# Patient Record
Sex: Male | Born: 1935 | Race: White | Hispanic: No | Marital: Single | Smoking: Former smoker
Health system: Southern US, Community
[De-identification: ages and names within clinical notes are randomized; demographics above are authoritative.]

## PROBLEM LIST (undated history)

## (undated) DIAGNOSIS — G459 Transient cerebral ischemic attack, unspecified: Secondary | ICD-10-CM

## (undated) DIAGNOSIS — M179 Osteoarthritis of knee, unspecified: Secondary | ICD-10-CM

## (undated) DIAGNOSIS — M069 Rheumatoid arthritis, unspecified: Secondary | ICD-10-CM

## (undated) DIAGNOSIS — M1710 Unilateral primary osteoarthritis, unspecified knee: Secondary | ICD-10-CM

## (undated) DIAGNOSIS — E039 Hypothyroidism, unspecified: Secondary | ICD-10-CM

## (undated) HISTORY — DX: Transient cerebral ischemic attack, unspecified: G45.9

## (undated) HISTORY — DX: Rheumatoid arthritis, unspecified: M06.9

## (undated) HISTORY — DX: Osteoarthritis of knee, unspecified: M17.9

## (undated) HISTORY — DX: Unilateral primary osteoarthritis, unspecified knee: M17.10

## (undated) HISTORY — DX: Hypothyroidism, unspecified: E03.9

---
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
## 1978-10-21 DIAGNOSIS — G459 Transient cerebral ischemic attack, unspecified: Secondary | ICD-10-CM

## 1978-10-21 HISTORY — DX: Transient cerebral ischemic attack, unspecified: G45.9

## 2004-02-20 HISTORY — PX: TOTAL KNEE ARTHROPLASTY: SHX125

## 2011-02-20 HISTORY — PX: INGUINAL HERNIA REPAIR: SUR1180

## 2011-02-22 DIAGNOSIS — M069 Rheumatoid arthritis, unspecified: Secondary | ICD-10-CM | POA: Diagnosis not present

## 2011-02-22 DIAGNOSIS — Z79899 Other long term (current) drug therapy: Secondary | ICD-10-CM | POA: Diagnosis not present

## 2011-03-22 DIAGNOSIS — H26019 Infantile and juvenile cortical, lamellar, or zonular cataract, unspecified eye: Secondary | ICD-10-CM | POA: Diagnosis not present

## 2011-03-22 DIAGNOSIS — H35369 Drusen (degenerative) of macula, unspecified eye: Secondary | ICD-10-CM | POA: Diagnosis not present

## 2011-04-04 DIAGNOSIS — H905 Unspecified sensorineural hearing loss: Secondary | ICD-10-CM | POA: Diagnosis not present

## 2011-04-04 DIAGNOSIS — H903 Sensorineural hearing loss, bilateral: Secondary | ICD-10-CM | POA: Diagnosis not present

## 2011-06-13 DIAGNOSIS — Z96659 Presence of unspecified artificial knee joint: Secondary | ICD-10-CM | POA: Diagnosis not present

## 2011-06-13 DIAGNOSIS — Z471 Aftercare following joint replacement surgery: Secondary | ICD-10-CM | POA: Diagnosis not present

## 2011-06-15 DIAGNOSIS — E785 Hyperlipidemia, unspecified: Secondary | ICD-10-CM | POA: Diagnosis not present

## 2011-06-15 DIAGNOSIS — N189 Chronic kidney disease, unspecified: Secondary | ICD-10-CM | POA: Diagnosis not present

## 2011-06-15 DIAGNOSIS — E039 Hypothyroidism, unspecified: Secondary | ICD-10-CM | POA: Diagnosis not present

## 2011-06-26 DIAGNOSIS — R300 Dysuria: Secondary | ICD-10-CM | POA: Diagnosis not present

## 2011-08-30 DIAGNOSIS — M069 Rheumatoid arthritis, unspecified: Secondary | ICD-10-CM | POA: Diagnosis not present

## 2011-08-30 DIAGNOSIS — Z79899 Other long term (current) drug therapy: Secondary | ICD-10-CM | POA: Diagnosis not present

## 2011-08-30 DIAGNOSIS — M25579 Pain in unspecified ankle and joints of unspecified foot: Secondary | ICD-10-CM | POA: Diagnosis not present

## 2011-09-11 DIAGNOSIS — N390 Urinary tract infection, site not specified: Secondary | ICD-10-CM | POA: Diagnosis not present

## 2011-09-11 DIAGNOSIS — D899 Disorder involving the immune mechanism, unspecified: Secondary | ICD-10-CM | POA: Diagnosis not present

## 2011-09-11 DIAGNOSIS — N401 Enlarged prostate with lower urinary tract symptoms: Secondary | ICD-10-CM | POA: Diagnosis not present

## 2011-09-20 DIAGNOSIS — H26019 Infantile and juvenile cortical, lamellar, or zonular cataract, unspecified eye: Secondary | ICD-10-CM | POA: Diagnosis not present

## 2011-10-19 DIAGNOSIS — Z23 Encounter for immunization: Secondary | ICD-10-CM | POA: Diagnosis not present

## 2011-10-25 DIAGNOSIS — H903 Sensorineural hearing loss, bilateral: Secondary | ICD-10-CM | POA: Diagnosis not present

## 2011-11-05 DIAGNOSIS — K4090 Unilateral inguinal hernia, without obstruction or gangrene, not specified as recurrent: Secondary | ICD-10-CM | POA: Diagnosis not present

## 2011-11-16 DIAGNOSIS — K4090 Unilateral inguinal hernia, without obstruction or gangrene, not specified as recurrent: Secondary | ICD-10-CM | POA: Diagnosis not present

## 2011-11-16 DIAGNOSIS — Z01818 Encounter for other preprocedural examination: Secondary | ICD-10-CM | POA: Diagnosis not present

## 2011-11-26 DIAGNOSIS — L570 Actinic keratosis: Secondary | ICD-10-CM | POA: Diagnosis not present

## 2011-11-26 DIAGNOSIS — L821 Other seborrheic keratosis: Secondary | ICD-10-CM | POA: Diagnosis not present

## 2011-11-26 DIAGNOSIS — L738 Other specified follicular disorders: Secondary | ICD-10-CM | POA: Diagnosis not present

## 2011-11-26 DIAGNOSIS — L578 Other skin changes due to chronic exposure to nonionizing radiation: Secondary | ICD-10-CM | POA: Diagnosis not present

## 2011-11-27 DIAGNOSIS — K4090 Unilateral inguinal hernia, without obstruction or gangrene, not specified as recurrent: Secondary | ICD-10-CM | POA: Diagnosis not present

## 2011-11-27 DIAGNOSIS — R1032 Left lower quadrant pain: Secondary | ICD-10-CM | POA: Diagnosis not present

## 2012-02-19 DIAGNOSIS — R197 Diarrhea, unspecified: Secondary | ICD-10-CM | POA: Diagnosis not present

## 2012-02-21 DIAGNOSIS — E860 Dehydration: Secondary | ICD-10-CM | POA: Diagnosis not present

## 2012-02-21 DIAGNOSIS — R5383 Other fatigue: Secondary | ICD-10-CM | POA: Diagnosis not present

## 2012-02-21 DIAGNOSIS — R109 Unspecified abdominal pain: Secondary | ICD-10-CM | POA: Diagnosis not present

## 2012-02-21 DIAGNOSIS — R404 Transient alteration of awareness: Secondary | ICD-10-CM | POA: Diagnosis not present

## 2012-02-21 DIAGNOSIS — R197 Diarrhea, unspecified: Secondary | ICD-10-CM | POA: Diagnosis not present

## 2012-02-24 DIAGNOSIS — R197 Diarrhea, unspecified: Secondary | ICD-10-CM | POA: Diagnosis not present

## 2012-02-24 DIAGNOSIS — K649 Unspecified hemorrhoids: Secondary | ICD-10-CM | POA: Diagnosis not present

## 2012-02-24 DIAGNOSIS — N179 Acute kidney failure, unspecified: Secondary | ICD-10-CM | POA: Diagnosis not present

## 2012-02-24 DIAGNOSIS — Z8249 Family history of ischemic heart disease and other diseases of the circulatory system: Secondary | ICD-10-CM | POA: Diagnosis not present

## 2012-02-24 DIAGNOSIS — M069 Rheumatoid arthritis, unspecified: Secondary | ICD-10-CM | POA: Diagnosis present

## 2012-02-24 DIAGNOSIS — Z7982 Long term (current) use of aspirin: Secondary | ICD-10-CM | POA: Diagnosis not present

## 2012-02-24 DIAGNOSIS — Z96659 Presence of unspecified artificial knee joint: Secondary | ICD-10-CM | POA: Diagnosis not present

## 2012-02-24 DIAGNOSIS — K5289 Other specified noninfective gastroenteritis and colitis: Secondary | ICD-10-CM | POA: Diagnosis not present

## 2012-02-24 DIAGNOSIS — K6389 Other specified diseases of intestine: Secondary | ICD-10-CM | POA: Diagnosis not present

## 2012-02-24 DIAGNOSIS — Z87891 Personal history of nicotine dependence: Secondary | ICD-10-CM | POA: Diagnosis not present

## 2012-02-24 DIAGNOSIS — E871 Hypo-osmolality and hyponatremia: Secondary | ICD-10-CM | POA: Diagnosis present

## 2012-02-24 DIAGNOSIS — R141 Gas pain: Secondary | ICD-10-CM | POA: Diagnosis not present

## 2012-02-24 DIAGNOSIS — E860 Dehydration: Secondary | ICD-10-CM | POA: Diagnosis not present

## 2012-02-24 DIAGNOSIS — E876 Hypokalemia: Secondary | ICD-10-CM | POA: Diagnosis not present

## 2012-02-24 DIAGNOSIS — K5730 Diverticulosis of large intestine without perforation or abscess without bleeding: Secondary | ICD-10-CM | POA: Diagnosis present

## 2012-03-20 DIAGNOSIS — M069 Rheumatoid arthritis, unspecified: Secondary | ICD-10-CM | POA: Diagnosis not present

## 2012-03-20 DIAGNOSIS — Z79899 Other long term (current) drug therapy: Secondary | ICD-10-CM | POA: Diagnosis not present

## 2012-03-20 DIAGNOSIS — M25579 Pain in unspecified ankle and joints of unspecified foot: Secondary | ICD-10-CM | POA: Diagnosis not present

## 2012-03-24 DIAGNOSIS — H26019 Infantile and juvenile cortical, lamellar, or zonular cataract, unspecified eye: Secondary | ICD-10-CM | POA: Diagnosis not present

## 2012-03-24 DIAGNOSIS — H33309 Unspecified retinal break, unspecified eye: Secondary | ICD-10-CM | POA: Diagnosis not present

## 2012-03-24 DIAGNOSIS — H35369 Drusen (degenerative) of macula, unspecified eye: Secondary | ICD-10-CM | POA: Diagnosis not present

## 2012-03-28 DIAGNOSIS — R197 Diarrhea, unspecified: Secondary | ICD-10-CM | POA: Diagnosis not present

## 2012-04-08 DIAGNOSIS — G589 Mononeuropathy, unspecified: Secondary | ICD-10-CM | POA: Diagnosis not present

## 2012-04-09 DIAGNOSIS — M25579 Pain in unspecified ankle and joints of unspecified foot: Secondary | ICD-10-CM | POA: Diagnosis not present

## 2012-04-09 DIAGNOSIS — Z79899 Other long term (current) drug therapy: Secondary | ICD-10-CM | POA: Diagnosis not present

## 2012-04-09 DIAGNOSIS — M069 Rheumatoid arthritis, unspecified: Secondary | ICD-10-CM | POA: Diagnosis not present

## 2012-04-22 DIAGNOSIS — G589 Mononeuropathy, unspecified: Secondary | ICD-10-CM | POA: Diagnosis not present

## 2012-04-22 DIAGNOSIS — R209 Unspecified disturbances of skin sensation: Secondary | ICD-10-CM | POA: Diagnosis not present

## 2012-05-09 DIAGNOSIS — E039 Hypothyroidism, unspecified: Secondary | ICD-10-CM | POA: Diagnosis not present

## 2012-05-09 DIAGNOSIS — M069 Rheumatoid arthritis, unspecified: Secondary | ICD-10-CM | POA: Diagnosis not present

## 2012-05-09 DIAGNOSIS — I739 Peripheral vascular disease, unspecified: Secondary | ICD-10-CM | POA: Diagnosis not present

## 2012-05-09 DIAGNOSIS — E785 Hyperlipidemia, unspecified: Secondary | ICD-10-CM | POA: Diagnosis not present

## 2012-06-17 DIAGNOSIS — Z471 Aftercare following joint replacement surgery: Secondary | ICD-10-CM | POA: Diagnosis not present

## 2012-06-17 DIAGNOSIS — Z96659 Presence of unspecified artificial knee joint: Secondary | ICD-10-CM | POA: Diagnosis not present

## 2012-08-12 DIAGNOSIS — E785 Hyperlipidemia, unspecified: Secondary | ICD-10-CM | POA: Diagnosis not present

## 2012-08-12 DIAGNOSIS — E039 Hypothyroidism, unspecified: Secondary | ICD-10-CM | POA: Diagnosis not present

## 2012-09-16 DIAGNOSIS — Z79899 Other long term (current) drug therapy: Secondary | ICD-10-CM | POA: Diagnosis not present

## 2012-09-16 DIAGNOSIS — N401 Enlarged prostate with lower urinary tract symptoms: Secondary | ICD-10-CM | POA: Diagnosis not present

## 2012-09-16 DIAGNOSIS — M25569 Pain in unspecified knee: Secondary | ICD-10-CM | POA: Diagnosis not present

## 2012-09-16 DIAGNOSIS — M069 Rheumatoid arthritis, unspecified: Secondary | ICD-10-CM | POA: Diagnosis not present

## 2012-09-16 DIAGNOSIS — M25579 Pain in unspecified ankle and joints of unspecified foot: Secondary | ICD-10-CM | POA: Diagnosis not present

## 2012-09-16 DIAGNOSIS — N390 Urinary tract infection, site not specified: Secondary | ICD-10-CM | POA: Diagnosis not present

## 2012-09-22 DIAGNOSIS — H26019 Infantile and juvenile cortical, lamellar, or zonular cataract, unspecified eye: Secondary | ICD-10-CM | POA: Diagnosis not present

## 2012-09-22 DIAGNOSIS — H33309 Unspecified retinal break, unspecified eye: Secondary | ICD-10-CM | POA: Diagnosis not present

## 2012-09-22 DIAGNOSIS — H35369 Drusen (degenerative) of macula, unspecified eye: Secondary | ICD-10-CM | POA: Diagnosis not present

## 2012-09-23 DIAGNOSIS — M25569 Pain in unspecified knee: Secondary | ICD-10-CM | POA: Diagnosis not present

## 2012-10-08 DIAGNOSIS — H35369 Drusen (degenerative) of macula, unspecified eye: Secondary | ICD-10-CM | POA: Diagnosis not present

## 2012-10-08 DIAGNOSIS — H33309 Unspecified retinal break, unspecified eye: Secondary | ICD-10-CM | POA: Diagnosis not present

## 2012-10-08 DIAGNOSIS — H26019 Infantile and juvenile cortical, lamellar, or zonular cataract, unspecified eye: Secondary | ICD-10-CM | POA: Diagnosis not present

## 2012-10-10 DIAGNOSIS — H2589 Other age-related cataract: Secondary | ICD-10-CM | POA: Diagnosis not present

## 2012-10-16 DIAGNOSIS — H2589 Other age-related cataract: Secondary | ICD-10-CM | POA: Diagnosis not present

## 2012-10-16 DIAGNOSIS — H52209 Unspecified astigmatism, unspecified eye: Secondary | ICD-10-CM | POA: Diagnosis not present

## 2012-10-30 DIAGNOSIS — H52209 Unspecified astigmatism, unspecified eye: Secondary | ICD-10-CM | POA: Diagnosis not present

## 2012-10-30 DIAGNOSIS — H2589 Other age-related cataract: Secondary | ICD-10-CM | POA: Diagnosis not present

## 2012-10-31 DIAGNOSIS — Z23 Encounter for immunization: Secondary | ICD-10-CM | POA: Diagnosis not present

## 2012-11-24 DIAGNOSIS — E039 Hypothyroidism, unspecified: Secondary | ICD-10-CM | POA: Diagnosis not present

## 2012-11-24 DIAGNOSIS — D649 Anemia, unspecified: Secondary | ICD-10-CM | POA: Diagnosis not present

## 2012-11-24 DIAGNOSIS — G988 Other disorders of nervous system: Secondary | ICD-10-CM | POA: Diagnosis not present

## 2012-11-25 DIAGNOSIS — L570 Actinic keratosis: Secondary | ICD-10-CM | POA: Diagnosis not present

## 2012-11-25 DIAGNOSIS — L578 Other skin changes due to chronic exposure to nonionizing radiation: Secondary | ICD-10-CM | POA: Diagnosis not present

## 2012-11-25 DIAGNOSIS — L821 Other seborrheic keratosis: Secondary | ICD-10-CM | POA: Diagnosis not present

## 2012-12-04 DIAGNOSIS — H6120 Impacted cerumen, unspecified ear: Secondary | ICD-10-CM | POA: Diagnosis not present

## 2012-12-04 DIAGNOSIS — H905 Unspecified sensorineural hearing loss: Secondary | ICD-10-CM | POA: Diagnosis not present

## 2013-01-02 DIAGNOSIS — M79609 Pain in unspecified limb: Secondary | ICD-10-CM | POA: Diagnosis not present

## 2013-01-05 DIAGNOSIS — M25579 Pain in unspecified ankle and joints of unspecified foot: Secondary | ICD-10-CM | POA: Diagnosis not present

## 2013-01-05 DIAGNOSIS — M069 Rheumatoid arthritis, unspecified: Secondary | ICD-10-CM | POA: Diagnosis not present

## 2013-01-05 DIAGNOSIS — Z79899 Other long term (current) drug therapy: Secondary | ICD-10-CM | POA: Diagnosis not present

## 2013-03-10 DIAGNOSIS — M069 Rheumatoid arthritis, unspecified: Secondary | ICD-10-CM | POA: Diagnosis not present

## 2013-03-10 DIAGNOSIS — M25549 Pain in joints of unspecified hand: Secondary | ICD-10-CM | POA: Diagnosis not present

## 2013-04-21 ENCOUNTER — Ambulatory Visit (INDEPENDENT_AMBULATORY_CARE_PROVIDER_SITE_OTHER): Payer: Medicare Other | Admitting: Internal Medicine

## 2013-04-21 ENCOUNTER — Encounter: Payer: Self-pay | Admitting: Internal Medicine

## 2013-04-21 ENCOUNTER — Ambulatory Visit: Payer: Self-pay | Admitting: Internal Medicine

## 2013-04-21 VITALS — BP 148/80 | HR 74 | Temp 98.3°F | Ht 64.5 in | Wt 148.0 lb

## 2013-04-21 DIAGNOSIS — M069 Rheumatoid arthritis, unspecified: Secondary | ICD-10-CM | POA: Diagnosis not present

## 2013-04-21 DIAGNOSIS — E039 Hypothyroidism, unspecified: Secondary | ICD-10-CM

## 2013-04-21 DIAGNOSIS — Z23 Encounter for immunization: Secondary | ICD-10-CM | POA: Diagnosis not present

## 2013-04-21 DIAGNOSIS — G459 Transient cerebral ischemic attack, unspecified: Secondary | ICD-10-CM | POA: Diagnosis not present

## 2013-04-21 LAB — LIPID PANEL
CHOLESTEROL: 182 mg/dL (ref 0–200)
HDL: 60.4 mg/dL (ref >39.00)
LDL Cholesterol: 107 mg/dL — ABNORMAL HIGH (ref 0–99)
Total CHOL/HDL Ratio: 3
Triglycerides: 74 mg/dL (ref 0.0–149.0)
VLDL: 14.8 mg/dL (ref 0.0–40.0)

## 2013-04-21 LAB — TSH: TSH: 4.21 u[IU]/mL (ref 0.35–5.50)

## 2013-04-21 LAB — T4, FREE: FREE T4: 0.71 ng/dL (ref 0.60–1.60)

## 2013-04-21 NOTE — Patient Instructions (Signed)
Please decrease your aspirin to 81 mg daily.

## 2013-04-21 NOTE — Progress Notes (Signed)
Pre visit review using our clinic review tool, if applicable. No additional management support is needed unless otherwise documented below in the visit note. 

## 2013-04-21 NOTE — Assessment & Plan Note (Signed)
Quiet on enbrel Seeing Dr Jefm Bryant  Will defer his labs to Dr Raliegh Ip

## 2013-04-21 NOTE — Progress Notes (Signed)
Subjective:    Patient ID: Isaac Dixon, male    DOB: 1920-11-21, 80 y.o.   MRN: [id number]  HPI Here to establish care Moved to [facility] with his partner Isaac Dixon in December They love it there Has started with Isaac Dixon in the fitness program Exercises regularly---had been a gymnast in high school (considered for Olympics but canceled due to [facility])  Has RA---on enbrel since 2000 Seeing Dr Jefm Bryant here Satisfied with this---"it has been a miracle drug" Mostly confined to hands  Has hypothyroidism First diagnosed last year  TSH had gone over 10 he believes Never had symptoms  Had TIA about 30 years ago Has taken aspirin since then No carotid blockage No problems since then Cholesterol levels are fine  No current outpatient prescriptions on file prior to visit.   No current facility-administered medications on file prior to visit.    No Known Allergies  Past Medical History  Diagnosis Date  . TIA (transient ischemic attack) 1980's    negative work up  . Rheumatoid arthritis     sees Dr Jefm Bryant  . Hypothyroidism   . Osteoarthritis, knee     just left---better after TKR.    Past Surgical History  Procedure Laterality Date  . Total knee arthroplasty Left 2006    original 1999 then replaced due to infection  . Tonsillectomy and adenoidectomy  1938  . Inguinal hernia repair  2013    Family History  Problem Relation Age of Onset  . COPD Father   . Heart disease Brother     History   Social History  . Marital Status: Significant Other    Spouse Name: N/A    Number of Children: 3  . Years of Education: N/A   Occupational History  . Not on file.   Social History Main Topics  . Smoking status: Former Research scientist (life sciences)  . Smokeless tobacco: Never Used  . Alcohol Use: Yes  . Drug Use: No  . Sexual Activity: Not on file   Other Topics Concern  . Not on file   Social History Narrative   Widowed 5/07   Together with Isaac Dixon since 2008.      Has living  will   Health care POA is partner Isaac Dixon. Son is alternate   Would accept attempts at resuscitation   No feeding tube if cognitively unaware   Review of Systems  Constitutional: Negative for fatigue and unexpected weight change.  HENT: Positive for hearing loss. Negative for dental problem.   Eyes: Negative for visual disturbance.  Respiratory: Negative for cough, chest tightness and shortness of breath.   Cardiovascular: Negative for chest pain, palpitations and leg swelling.  Gastrointestinal: Negative for nausea, vomiting, constipation and blood in stool.       No heartburn  Genitourinary: Negative for urgency, frequency and difficulty urinating.  Musculoskeletal: Negative for back pain and joint swelling.  Skin: Negative for rash.       No suspicious lesions  Neurological: Negative for dizziness, syncope, weakness, light-headedness and headaches.  Hematological: Negative for adenopathy. Does not bruise/bleed easily.  Psychiatric/Behavioral: Negative for sleep disturbance and dysphoric mood. The patient is not nervous/anxious.        Objective:   Physical Exam  Constitutional: He is oriented to person, place, and time. He appears well-developed and well-nourished. No distress.  HENT:  Mouth/Throat: Oropharynx is clear and moist. No oropharyngeal exudate.  Neck: Normal range of motion. Neck supple. No thyromegaly present.  Cardiovascular: Normal rate, regular rhythm  and normal heart sounds.  Exam reveals no gallop.   No murmur heard. Faint pedal pulses  Pulmonary/Chest: Effort normal and breath sounds normal. No respiratory distress. He has no wheezes. He has no rales.  Abdominal: Soft. There is no tenderness.  Musculoskeletal:  No active synovitis Mild ulnar deviation in hands  Lymphadenopathy:    He has no cervical adenopathy.  Neurological: He is alert and oriented to person, place, and time.  Skin: No rash noted. No erythema.  Psychiatric: He has a normal mood and  affect. His behavior is normal.          Assessment & Plan:

## 2013-04-21 NOTE — Assessment & Plan Note (Signed)
Doesn't really seem to have vascular disease No action unless new symptoms

## 2013-04-21 NOTE — Addendum Note (Signed)
Addended by: Despina Hidden on: 04/21/2013 01:01 PM   Modules accepted: Orders

## 2013-04-21 NOTE — Assessment & Plan Note (Signed)
Sounds subclinical but TSH high enough for low dose Rx Will check labs and cholesterol

## 2013-04-22 ENCOUNTER — Encounter: Payer: Self-pay | Admitting: *Deleted

## 2013-04-23 DIAGNOSIS — M069 Rheumatoid arthritis, unspecified: Secondary | ICD-10-CM | POA: Diagnosis not present

## 2013-05-12 ENCOUNTER — Ambulatory Visit: Payer: Self-pay | Admitting: Family Medicine

## 2013-06-09 DIAGNOSIS — M069 Rheumatoid arthritis, unspecified: Secondary | ICD-10-CM | POA: Diagnosis not present

## 2013-06-09 DIAGNOSIS — Z111 Encounter for screening for respiratory tuberculosis: Secondary | ICD-10-CM | POA: Diagnosis not present

## 2013-07-01 ENCOUNTER — Other Ambulatory Visit: Payer: Self-pay | Admitting: Family Medicine

## 2013-07-01 MED ORDER — LEVOTHYROXINE SODIUM 25 MCG PO TABS: 25.0000 ug | ORAL_TABLET | Freq: Every day | ORAL | Status: DC

## 2013-09-02 ENCOUNTER — Ambulatory Visit: Payer: Self-pay | Admitting: Internal Medicine

## 2013-09-10 DIAGNOSIS — M069 Rheumatoid arthritis, unspecified: Secondary | ICD-10-CM | POA: Diagnosis not present

## 2013-10-06 DIAGNOSIS — M25569 Pain in unspecified knee: Secondary | ICD-10-CM | POA: Diagnosis not present

## 2013-10-06 DIAGNOSIS — Z96659 Presence of unspecified artificial knee joint: Secondary | ICD-10-CM | POA: Diagnosis not present

## 2013-10-23 DIAGNOSIS — Z23 Encounter for immunization: Secondary | ICD-10-CM | POA: Diagnosis not present

## 2013-12-31 ENCOUNTER — Encounter: Payer: Self-pay | Admitting: Podiatry

## 2013-12-31 ENCOUNTER — Ambulatory Visit (INDEPENDENT_AMBULATORY_CARE_PROVIDER_SITE_OTHER): Payer: Medicare Other | Admitting: Podiatry

## 2013-12-31 VITALS — BP 134/60 | HR 72 | Resp 16 | Ht 64.0 in | Wt 145.0 lb

## 2013-12-31 DIAGNOSIS — L03818 Cellulitis of other sites: Secondary | ICD-10-CM

## 2013-12-31 DIAGNOSIS — L03012 Cellulitis of left finger: Secondary | ICD-10-CM

## 2013-12-31 MED ORDER — CEPHALEXIN 500 MG PO CAPS: 500.0000 mg | ORAL_CAPSULE | Freq: Three times a day (TID) | ORAL | Status: DC

## 2013-12-31 NOTE — Progress Notes (Signed)
   Subjective:    Patient ID: Isaac Dixon, male    DOB: 18-Aug-1920, 80 y.o.   MRN: [id number]  HPI Comments: 80 year old male presents the office today with his wife for complaints of a "boil" on the left big toe. He states his been present for approximate 4 days and has remained the same. He states that the areas. Painful in his pain for when he walks particularly. He has noticed some purulence around the nail. He's been soaking the foot in Epson salts and applying antibiotic ointment to the area. He's been taking Aleve for the pain. Denies any injury or trauma to the area. No other complaints at this time. Denies any systemic complaints such as fevers, chills, nausea, vomiting.  Toe Pain       Review of Systems  All other systems reviewed and are negative.      Objective:   Physical Exam AAO 3, NAD DP/PT pulses palpable decreased bilaterally. CRT less than 3 seconds Protective sensation intact with Simms Weinstein monofilament, vibratory sensation intact, Achilles tendon reflex intact. On the left hallux there is evidence of purulence identified from the proximal nail border and there is evidence of a granuloma like tissue around the nail border. There is erythema extending to the level of the IP joint. There is tenderness around the entire nail border. The nail is lifted from the underlying nail bed proximally. There are no areas of fluctuance or crepitus. There is no ascending cellulitis identified. Remaining nails without pathology. No other lesions identified. MMT 5/5, ROM WNL No calf pain, swelling, warmth, erythema.       Assessment & Plan:  80 year old male with paronychia, localized cellulitis left hallux. -Conservative versus surgical treatment discussed including alternatives, risks, complications. -At this time due to the lifting of the nail with purulence and cellulitis recommend removal of the toenail. I discussed with the patient that he is at high risk of  nonhealing due to decreased pulses as well as being on Enbrel. Patient understands these risks which include further infection, delayed or nonhealing, loss of toe and wishes to proceed with the procedure. Under sterile conditions a total of 2.5 mL of a one-to-one mixture of 2% lidocaine plain and 0.5% Marcaine plain was infiltrated in a hallux block fashion. Once anesthetized the skin was prepped in a sterile fashion. Next the left hallux nail was then removed without complications. Underlying tissue was debrided. Small amount of purulence was identified in the proximal nail border. Upon removal of the nail and debridement of the tissue no further purulence identified. The area was then irrigated. Silvadene was applied followed by dry sterile dressing. No tourniquet was used for the procedure not the conclusion there was noted to be an immediate capillary refill time noted to the digit. Patient tolerated the procedure well without any complications. -Post procedure instructions were discussed with the patient for which she verbally understood. -Prescribed Keflex. -Monitoring clinical signs or symptoms of worsening infection and directed to call the office immediately if any are to occur or go to the emergency room. -Discussed that if there not appear to be adequate healing in the appropriate time we'll obtain vascular studies. -Follow-up in 1 week or sooner if any problems are to arise. In the meantime, call the office with any questions, concerns, change in symptoms.

## 2013-12-31 NOTE — Patient Instructions (Addendum)

## 2014-01-01 ENCOUNTER — Encounter: Payer: Self-pay | Admitting: Podiatry

## 2014-01-01 DIAGNOSIS — IMO0002 Reserved for concepts with insufficient information to code with codable children: Secondary | ICD-10-CM | POA: Insufficient documentation

## 2014-01-04 DIAGNOSIS — L03039 Cellulitis of unspecified toe: Secondary | ICD-10-CM

## 2014-01-05 ENCOUNTER — Ambulatory Visit: Payer: Medicare Other | Admitting: Podiatry

## 2014-01-07 ENCOUNTER — Ambulatory Visit (INDEPENDENT_AMBULATORY_CARE_PROVIDER_SITE_OTHER): Payer: Medicare Other | Admitting: Podiatry

## 2014-01-07 ENCOUNTER — Encounter: Payer: Self-pay | Admitting: Podiatry

## 2014-01-07 VITALS — BP 125/65 | HR 77 | Resp 16

## 2014-01-07 DIAGNOSIS — L03012 Cellulitis of left finger: Secondary | ICD-10-CM

## 2014-01-07 DIAGNOSIS — L03039 Cellulitis of unspecified toe: Secondary | ICD-10-CM

## 2014-01-07 NOTE — Patient Instructions (Signed)
Continue to soak twice a day in epsom salt followed by antibiotic ointment and a band-aid. Can leave uncovered at night. Finish course of antibiotic  Monitor for any signs/symptoms of infection. Call the office immediately if any occur or go directly to the emergency room. Call with any questions/concerns.

## 2014-01-07 NOTE — Progress Notes (Signed)
Patient ID: Isaac Dixon, male   DOB: [birth year], 80 y.o.   MRN: [id number]  Subjective: 80 year old male returns the office today for 1 week follow-up evaluation status post left hallux nail avulsion secondary to paronychia. He states he's been continuing with Betadine soaks twice a day followed by antibiotic ointment and a Band-Aid. He is continue with Keflex without any side effects. He does state that over the weekend he had some increasing pain however has dissipated. Denies any systemic complaints as fevers, chills, nausea, vomiting. No acute changes since last appointment. No other complaints at this time.  Objective: AAO 3, NAD DP/PT pulses palpable bilaterally, CRT less than 3 seconds Protective sensation intact Simms Weinstein monofilament Left hallux status post total nail avulsion. Than nail bed is healing appropriately for this timeframe. There is a small amount of granulation tissue within the central aspect of the nail bed. There is mild erythema around the digit all the decrease in prior appointment. There is no areas of fluctuance or crepitus. No purulence identified. Small amount of serous drainage is from the procedure site. There is no ascending cellulitis. No malodor. Nails hypertrophic, dystrophic, brittle, discolored without any surrounding erythema or drainage from the nail sites. No calf pain with compression, swelling, warmth, erythema.  Assessment: 80 year old male 1 week status post left hallux total nail avulsion secondary to paronychia  Plan: -Treatment options were discussed including alternatives, risks, complications. -At this time recommend continue with Epsom salts soaks twice a day followed by an abided ointment and a Band-Aid. Can leave uncovered at night. -Finish course of antibiotics. Monitor for any clinical signs or symptoms of infection and directed to call the office immediately if any are to occur or go directly to the emergency room. -Follow-up in 2  weeks. In the meantime, call the office with any questions, concerns, change in symptoms.

## 2014-01-21 ENCOUNTER — Ambulatory Visit (INDEPENDENT_AMBULATORY_CARE_PROVIDER_SITE_OTHER): Payer: Medicare Other | Admitting: Podiatry

## 2014-01-21 ENCOUNTER — Encounter: Payer: Self-pay | Admitting: Podiatry

## 2014-01-21 VITALS — BP 129/63 | HR 77

## 2014-01-21 DIAGNOSIS — L03039 Cellulitis of unspecified toe: Secondary | ICD-10-CM

## 2014-01-21 DIAGNOSIS — G629 Polyneuropathy, unspecified: Secondary | ICD-10-CM | POA: Diagnosis not present

## 2014-01-21 DIAGNOSIS — L03012 Cellulitis of left finger: Secondary | ICD-10-CM

## 2014-01-21 NOTE — Patient Instructions (Signed)
Monitor for any signs/symptoms of infection. Call the office immediately if any occur or go directly to the emergency room. Call with any questions/concerns.  

## 2014-01-21 NOTE — Progress Notes (Signed)
Patient ID: Isaac Dixon, male   DOB: [birth year], 80 y.o.   MRN: [id number]  Subjective: 80 year old male returns the office they for follow-up evaluation status post left hallux total nail avulsion secondary to paronychia. He states he has discontinue soaking as well as covering the area then about ointment were Band-Aid as it is healed. He denies any drainage or purulence from around the nail site. No tenderness to palpation. Denies any systemic complaints as fevers, chills, nausea, vomiting. Also today he has secondary questions about possible treatments for neuropathy. He has previously seen a neurologist who was diagnosed him with neuropathy. He also previously had B-12 supplementation. He is inquiring about further B vitamin supplementation or gabapentin. No other complaints at this time.  Objective: AAO 3, NAD DP/PT pulses palpable bilaterally, CRT less than 3 seconds Neurological status unchanged. Left hallux status post total nail avulsion which is healed at this time. There is overlying hyperkeratotic tissue. There is no swelling erythema, ascending saline as. No drainage or purulence identified. No clinical signs of infection. No open lesions. MMT 5/5, ROM WNL  Assessment: 80 year old male status post left hallux total nail avulsion secondary to paronychia  Plan: -Treatment options discussed including alternatives, risks, complications. -At this time the procedure site is healed. Can cover with a Band-Aid if needed. Monitor for any signs or symptoms of infection. -Patient brought and records from previous neurologist which revealed neuropathy. I discussed him that there are other options for treatment and neuropathy such as gabapentin or other vitamin supplementation. However as he is present been treated for this I recommended the patient follow up with his primary care physician discussed various treatment options. -Follow-up as needed. In the meantime, call the office with any  questions, concerns, change in symptoms.

## 2014-02-01 ENCOUNTER — Telehealth: Payer: Self-pay

## 2014-02-01 NOTE — Telephone Encounter (Signed)
Please call [facility] to do ear washing

## 2014-02-01 NOTE — Telephone Encounter (Signed)
Pt left v/m; pt request order to [facility] at [facility] to do ear washing. Please advise. Pt last seen at office 04/21/13.

## 2014-02-02 NOTE — Telephone Encounter (Signed)
Spoke with Sharee Pimple and she needs a written order, will prepare for Dr. Silvio Pate to sign Order faxed and scanned.

## 2014-02-02 NOTE — Telephone Encounter (Signed)
Left message for Isaac Dixon @ [facility] to return my call

## 2014-02-08 ENCOUNTER — Encounter: Payer: Self-pay | Admitting: Internal Medicine

## 2014-03-04 ENCOUNTER — Encounter: Payer: Self-pay | Admitting: Internal Medicine

## 2014-03-05 ENCOUNTER — Encounter: Payer: Self-pay | Admitting: Internal Medicine

## 2014-03-05 ENCOUNTER — Ambulatory Visit (INDEPENDENT_AMBULATORY_CARE_PROVIDER_SITE_OTHER): Payer: Medicare Other | Admitting: Internal Medicine

## 2014-03-05 VITALS — BP 116/68 | HR 77 | Temp 98.0°F | Wt 148.0 lb

## 2014-03-05 DIAGNOSIS — N390 Urinary tract infection, site not specified: Secondary | ICD-10-CM | POA: Diagnosis not present

## 2014-03-05 DIAGNOSIS — R300 Dysuria: Secondary | ICD-10-CM | POA: Diagnosis not present

## 2014-03-05 LAB — POCT URINALYSIS DIPSTICK
Bilirubin, UA: NEGATIVE
Glucose, UA: NEGATIVE
Ketones, UA: NEGATIVE
Leukocytes, UA: NEGATIVE
NITRITE UA: NEGATIVE
Spec Grav, UA: 1.02
Urobilinogen, UA: NEGATIVE
pH, UA: 6

## 2014-03-05 MED ORDER — CIPROFLOXACIN HCL 250 MG PO TABS: 250.0000 mg | ORAL_TABLET | Freq: Two times a day (BID) | ORAL | Status: DC

## 2014-03-05 NOTE — Progress Notes (Signed)
HPI  Pt presents to the clinic today with c/o dysuria and a decrease in urination. He reports this started yesterday. He is having trouble forcing his urine out because it is painful. He denies fever, chills or low back. He does not have an issues with his prostate that he is aware of.   Review of Systems  Past Medical History  Diagnosis Date  . TIA (transient ischemic attack) 1980's    negative work up  . Rheumatoid arthritis     sees Dr Jefm Bryant  . Hypothyroidism   . Osteoarthritis, knee     just left---better after TKR.    Family History  Problem Relation Age of Onset  . COPD Father   . Heart disease Brother     History   Social History  . Marital Status: Significant Other    Spouse Name: N/A    Number of Children: 3  . Years of Education: N/A   Occupational History  . Retired Marine scientist    Social History Main Topics  . Smoking status: Former Research scientist (life sciences)  . Smokeless tobacco: Never Used  . Alcohol Use: 0.0 oz/week    0 Not specified per week  . Drug Use: No  . Sexual Activity: Not on file   Other Topics Concern  . Not on file   Social History Narrative   Widowed 5/07   Together with [name] since 2008.      Has living will   Health care POA is partner [name]. Son is alternate   Would accept attempts at resuscitation   No feeding tube if cognitively unaware    No Known Allergies  Constitutional: Denies fever, malaise, fatigue, headache or abrupt weight changes.   GU: Pt reports dysuria. Pt reports hesitancy and pain with urination. Denies burning sensation, blood in urine, odor or discharge. Skin: Denies redness, rashes, lesions or ulcercations.   No other specific complaints in a complete review of systems (except as listed in HPI above).    Objective:   Physical Exam  BP 116/68 mmHg  Pulse 77  Temp(Src) 98 F (36.7 C) (Oral)  Wt 148 lb (67.132 kg)  SpO2 97% Wt Readings from Last 3 Encounters:  03/05/14 148 lb (67.132 kg)  12/31/13 145 lb  (65.772 kg)  04/21/13 148 lb (67.132 kg)    General: Appears his stated age, well developed, well nourished in NAD. Cardiovascular: Normal rate and rhythm. S1,S2 noted.  No murmur, rubs or gallops noted.  Pulmonary/Chest: Normal effort and positive vesicular breath sounds. No respiratory distress. No wheezes, rales or ronchi noted.  Abdomen: Soft and nontender. Normal bowel sounds, no bruits noted. No distention or masses noted. Liver, spleen and kidneys non palpable. No CVA tenderness. Rectal: Normal rectal tone, prostate slightly enlarged but firm, not boggy or painful.     Assessment & Plan:   Dysuria  Urinalysis: 1+ leuks, pos blood Will send for urine culture eRx sent if for Cipro 250 mg BID x 7 days OK to take AZO OTC Drink plenty of fluids  RTC as needed or if symptoms persist.

## 2014-03-05 NOTE — Patient Instructions (Signed)

## 2014-03-05 NOTE — Progress Notes (Signed)
Pre visit review using our clinic review tool, if applicable. No additional management support is needed unless otherwise documented below in the visit note. 

## 2014-03-05 NOTE — Addendum Note (Signed)
Addended by: Lurlean Nanny on: 03/05/2014 04:50 PM   Modules accepted: Orders

## 2014-03-07 LAB — URINE CULTURE: Colony Count: 4000

## 2014-03-08 ENCOUNTER — Ambulatory Visit: Payer: Medicare Other | Admitting: Internal Medicine

## 2014-03-08 DIAGNOSIS — M0579 Rheumatoid arthritis with rheumatoid factor of multiple sites without organ or systems involvement: Secondary | ICD-10-CM | POA: Diagnosis not present

## 2014-04-15 DIAGNOSIS — G629 Polyneuropathy, unspecified: Secondary | ICD-10-CM | POA: Diagnosis not present

## 2014-04-15 DIAGNOSIS — Z96652 Presence of left artificial knee joint: Secondary | ICD-10-CM | POA: Diagnosis not present

## 2014-05-05 DIAGNOSIS — G609 Hereditary and idiopathic neuropathy, unspecified: Secondary | ICD-10-CM | POA: Diagnosis not present

## 2014-06-17 DIAGNOSIS — H43391 Other vitreous opacities, right eye: Secondary | ICD-10-CM | POA: Diagnosis not present

## 2014-07-05 ENCOUNTER — Other Ambulatory Visit: Payer: Self-pay | Admitting: Internal Medicine

## 2014-07-06 ENCOUNTER — Ambulatory Visit (INDEPENDENT_AMBULATORY_CARE_PROVIDER_SITE_OTHER): Payer: Medicare Other | Admitting: Internal Medicine

## 2014-07-06 ENCOUNTER — Encounter: Payer: Self-pay | Admitting: Internal Medicine

## 2014-07-06 VITALS — BP 138/70 | HR 77 | Temp 98.0°F | Ht 64.5 in | Wt 147.0 lb

## 2014-07-06 DIAGNOSIS — G629 Polyneuropathy, unspecified: Secondary | ICD-10-CM | POA: Diagnosis not present

## 2014-07-06 DIAGNOSIS — M069 Rheumatoid arthritis, unspecified: Secondary | ICD-10-CM

## 2014-07-06 DIAGNOSIS — Z0000 Encounter for general adult medical examination without abnormal findings: Secondary | ICD-10-CM

## 2014-07-06 DIAGNOSIS — Z7189 Other specified counseling: Secondary | ICD-10-CM

## 2014-07-06 DIAGNOSIS — E039 Hypothyroidism, unspecified: Secondary | ICD-10-CM | POA: Diagnosis not present

## 2014-07-06 LAB — CBC WITH DIFFERENTIAL/PLATELET
Basophils Absolute: 0 10*3/uL (ref 0.0–0.1)
Basophils Relative: 0.5 % (ref 0.0–3.0)
EOS ABS: 0.1 10*3/uL (ref 0.0–0.7)
EOS PCT: 1.7 % (ref 0.0–5.0)
HCT: 43.6 % (ref 39.0–52.0)
HEMOGLOBIN: 14.6 g/dL (ref 13.0–17.0)
LYMPHS PCT: 28.3 % (ref 12.0–46.0)
Lymphs Abs: 1.7 10*3/uL (ref 0.7–4.0)
MCHC: 33.4 g/dL (ref 30.0–36.0)
MCV: 92.1 fl (ref 78.0–100.0)
MONOS PCT: 14.2 % — AB (ref 3.0–12.0)
Monocytes Absolute: 0.8 10*3/uL (ref 0.1–1.0)
NEUTROS ABS: 3.3 10*3/uL (ref 1.4–7.7)
NEUTROS PCT: 55.3 % (ref 43.0–77.0)
Platelets: 229 10*3/uL (ref 150.0–400.0)
RBC: 4.74 Mil/uL (ref 4.22–5.81)
RDW: 14.7 % (ref 11.5–15.5)
WBC: 6 10*3/uL (ref 4.0–10.5)

## 2014-07-06 LAB — COMPREHENSIVE METABOLIC PANEL
ALBUMIN: 4 g/dL (ref 3.5–5.2)
ALT: 10 U/L (ref 0–53)
AST: 16 U/L (ref 0–37)
Alkaline Phosphatase: 56 U/L (ref 39–117)
BILIRUBIN TOTAL: 0.6 mg/dL (ref 0.2–1.2)
BUN: 22 mg/dL (ref 6–23)
CO2: 28 mEq/L (ref 19–32)
Calcium: 9.7 mg/dL (ref 8.4–10.5)
Chloride: 103 mEq/L (ref 96–112)
Creatinine, Ser: 1.56 mg/dL — ABNORMAL HIGH (ref 0.40–1.50)
GFR: 44.45 mL/min — ABNORMAL LOW (ref >60.00)
Glucose, Bld: 89 mg/dL (ref 70–99)
POTASSIUM: 4.1 meq/L (ref 3.5–5.1)
Sodium: 136 mEq/L (ref 135–145)
TOTAL PROTEIN: 7.3 g/dL (ref 6.0–8.3)

## 2014-07-06 LAB — T4, FREE: FREE T4: 0.7 ng/dL (ref 0.60–1.60)

## 2014-07-06 LAB — TSH: TSH: 9.39 u[IU]/mL — AB (ref 0.35–4.50)

## 2014-07-06 NOTE — Assessment & Plan Note (Signed)
I have personally reviewed the Medicare Annual Wellness questionnaire and have noted 1. The patient's medical and social history 2. Their use of alcohol, tobacco or illicit drugs 3. Their current medications and supplements 4. The patient's functional ability including ADL's, fall risks, home safety risks and hearing or visual             impairment. 5. Diet and physical activities 6. Evidence for depression or mood disorders  The patients weight, height, BMI and visual acuity have been recorded in the chart I have made referrals, counseling and provided education to the patient based review of the above and I have provided the pt with a written personalized care plan for preventive services.  I have provided you with a copy of your personalized plan for preventive services. Please take the time to review along with your updated medication list.  No cancer screening due to age No varivax due to enbrel UTD on other immunizations Does great with exercise

## 2014-07-06 NOTE — Assessment & Plan Note (Signed)
Seems to be euthyroid on low dose Will recheck labs

## 2014-07-06 NOTE — Progress Notes (Signed)
Pre visit review using our clinic review tool, if applicable. No additional management support is needed unless otherwise documented below in the visit note. 

## 2014-07-06 NOTE — Assessment & Plan Note (Signed)
Mild and without pain No Rx indicated

## 2014-07-06 NOTE — Assessment & Plan Note (Signed)
Quiet on the enbrel Will check labs

## 2014-07-06 NOTE — Progress Notes (Signed)
Subjective:    Patient ID: Isaac Dixon, male    DOB: 03-12-1920, 81 y.o.   MRN: [id number]  HPI  Here for Medicare wellness and follow up of chronic medical conditions Reviewed his advanced directives Sees Dr Jefm Bryant for RA, Dr Eugenie Birks is dentist, Hooten is ortho, Dr Eugene Gavia is eye doctor, Dr Manuella Ghazi for neurology No tobacco 1 glass of wine before dinner No procedures or hospitalizations last year Reviewed his meds No falls in past year No depression or anhedonia Monogamous with partner--still has sex Regular exercise Independent with instrumental ADLs Vision is fine Mild hearing loss--no major functional problems No apparent cognitive changes  No new concerns Feels great  He does note neuropathy in his legs Relates this to his past smoking? Had abnormal NCV in past--gets numbness No pain  RA --still sees Dr Jefm Bryant Happy with the enbrel Gold and MTX in the past enbrel has made all the difference  Feels his thyroid is okay Good energy levels No sig change in hair, nails or skin  Careful with caffeine--this caused his urinary symptoms? No recent problems with this  Current Outpatient Prescriptions on File Prior to Visit  Medication Sig Dispense Refill  . aspirin 81 MG tablet Take 81 mg by mouth daily.    Marland Kitchen etanercept (ENBREL) 50 MG/ML injection Inject 50 mg into the skin once a week.    . levothyroxine (SYNTHROID, LEVOTHROID) 25 MCG tablet take 1 tablet by mouth every morning ON AN EMPTY STOMACH 30 tablet 11   No current facility-administered medications on file prior to visit.    No Known Allergies  Past Medical History  Diagnosis Date  . TIA (transient ischemic attack) 1980's    negative work up  . Rheumatoid arthritis     sees Dr Jefm Bryant  . Hypothyroidism   . Osteoarthritis, knee     just left---better after TKR.    Past Surgical History  Procedure Laterality Date  . Total knee arthroplasty Left 2006    original 1999 then replaced due to  infection  . Tonsillectomy and adenoidectomy  1938  . Inguinal hernia repair  2013    Family History  Problem Relation Age of Onset  . COPD Father   . Heart disease Brother     History   Social History  . Marital Status: Significant Other    Spouse Name: N/A  . Number of Children: 3  . Years of Education: N/A   Occupational History  . Retired Marine scientist    Social History Main Topics  . Smoking status: Former Research scientist (life sciences)  . Smokeless tobacco: Never Used  . Alcohol Use: 0.0 oz/week    0 Standard drinks or equivalent per week  . Drug Use: No  . Sexual Activity: Not on file   Other Topics Concern  . Not on file   Social History Narrative   Widowed 5/07   Together with [name] since 2008.      Has living will   Health care POA is partner Isaac Dixon. Son is alternate   Would accept attempts at resuscitation   No feeding tube if cognitively unaware   Review of Systems Sleeps well Appetite is fine Weight stable No chest pain No SOB No dizziness or syncope No neurologic symptoms--no speech or swallowing problems, facial droop, weakness, etc Teeth are fine--keeps up with dentist Wears seat belt    Objective:   Physical Exam  Constitutional: He is oriented to person, place, and time. He appears well-developed and well-nourished. No distress.  HENT:  Mouth/Throat: Oropharynx is clear and moist. No oropharyngeal exudate.  Neck: Normal range of motion. Neck supple. No thyromegaly present.  Cardiovascular: Normal rate, regular rhythm and normal heart sounds.  Exam reveals no gallop.   No murmur heard. Feet warm but no palpable pulses  Pulmonary/Chest: Effort normal and breath sounds normal. No respiratory distress. He has no wheezes. He has no rales.  Abdominal: Soft. There is no tenderness.  Musculoskeletal: He exhibits no edema or tenderness.  Thickening in hand joints without active synovitis  Lymphadenopathy:    He has no cervical adenopathy.  Neurological: He is alert  and oriented to person, place, and time.  President-- [address] [place], [place]" [phone] [id number] Recall 1/3  Skin: No rash noted. No erythema.  Multiple seb keratoses  Psychiatric: He has a normal mood and affect. His behavior is normal.          Assessment & Plan:

## 2014-07-06 NOTE — Assessment & Plan Note (Signed)
See social history 

## 2014-10-03 DIAGNOSIS — Z23 Encounter for immunization: Secondary | ICD-10-CM | POA: Diagnosis not present

## 2014-10-31 DIAGNOSIS — Z23 Encounter for immunization: Secondary | ICD-10-CM | POA: Diagnosis not present

## 2014-12-07 DIAGNOSIS — M0579 Rheumatoid arthritis with rheumatoid factor of multiple sites without organ or systems involvement: Secondary | ICD-10-CM | POA: Diagnosis not present

## 2015-02-10 ENCOUNTER — Ambulatory Visit (INDEPENDENT_AMBULATORY_CARE_PROVIDER_SITE_OTHER): Payer: Medicare Other | Admitting: Internal Medicine

## 2015-02-10 ENCOUNTER — Encounter: Payer: Self-pay | Admitting: Internal Medicine

## 2015-02-10 VITALS — BP 138/80 | HR 73 | Temp 98.4°F | Wt 149.0 lb

## 2015-02-10 DIAGNOSIS — M25511 Pain in right shoulder: Secondary | ICD-10-CM | POA: Diagnosis not present

## 2015-02-10 NOTE — Progress Notes (Signed)
Pre visit review using our clinic review tool, if applicable. No additional management support is needed unless otherwise documented below in the visit note. 

## 2015-02-10 NOTE — Assessment & Plan Note (Signed)
He asks about cortisone shot No real arthritis or bursitis Shoulder exam is normal Probably muscular ----like biceps from holding arm up on mouse Discussed rest Okay aleve 1 at bedtime Try heat instead

## 2015-02-10 NOTE — Progress Notes (Signed)
   Subjective:    Patient ID: Isaac Dixon, male    DOB: [birth year], 81 y.o.   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 Seems to help at least a little Tried ice last night--not helpful  Has full movement Still doing his pushups, etc  Current Outpatient Prescriptions on File Prior to Visit  Medication Sig Dispense Refill  . aspirin 81 MG tablet Take 81 mg by mouth daily.    Marland Kitchen etanercept (ENBREL) 50 MG/ML injection Inject 50 mg into the skin once a week.    . levothyroxine (SYNTHROID, LEVOTHROID) 25 MCG tablet take 1 tablet by mouth every morning ON AN EMPTY STOMACH 30 tablet 11   No current facility-administered medications on file prior to visit.    No Known Allergies  Past Medical History  Diagnosis Date  . TIA (transient ischemic attack) 1980's    negative work up  . Rheumatoid arthritis [facility])     sees Dr Jefm Bryant  . Hypothyroidism   . Osteoarthritis, knee     just left---better after TKR.    Past Surgical History  Procedure Laterality Date  . Total knee arthroplasty Left 2006    original 1999 then replaced due to infection  . Tonsillectomy and adenoidectomy  1938  . Inguinal hernia repair  2013    Family History  Problem Relation Age of Onset  . COPD Father   . Heart disease Brother     Social History   Social History  . Marital Status: Significant Other    Spouse Name: N/A  . Number of Children: 3  . Years of Education: N/A   Occupational History  . Retired Marine scientist    Social History Main Topics  . Smoking status: Former Research scientist (life sciences)  . Smokeless tobacco: Never Used  . Alcohol Use: 0.0 oz/week    0 Standard drinks or equivalent per  week  . Drug Use: No  . Sexual Activity: Not on file   Other Topics Concern  . Not on file   Social History Narrative   Widowed 5/07   Together with Isaac Dixon since 2008.      Has living will   Health care POA is partner Isaac Dixon. Son is alternate   Would accept attempts at resuscitation   No feeding tube if cognitively unaware   Review of Systems  No fever No history of RA in shoulders     Objective:   Physical Exam  Constitutional: He appears well-developed and well-nourished. No distress.  Musculoskeletal:  Full active and passive ROM in right shoulder No bursa or joint tenderness No joint effusion No biceps tendon tenderness          Assessment & Plan:

## 2015-04-19 DIAGNOSIS — Z96652 Presence of left artificial knee joint: Secondary | ICD-10-CM | POA: Diagnosis not present

## 2015-06-20 DIAGNOSIS — H43391 Other vitreous opacities, right eye: Secondary | ICD-10-CM | POA: Diagnosis not present

## 2015-07-08 ENCOUNTER — Other Ambulatory Visit: Payer: Self-pay

## 2015-07-08 MED ORDER — LEVOTHYROXINE SODIUM 25 MCG PO TABS: ORAL_TABLET | ORAL | Status: DC

## 2015-07-08 NOTE — Telephone Encounter (Signed)
Rx sent electronically.  

## 2015-07-11 ENCOUNTER — Encounter: Payer: Self-pay | Admitting: Internal Medicine

## 2015-07-11 ENCOUNTER — Ambulatory Visit (INDEPENDENT_AMBULATORY_CARE_PROVIDER_SITE_OTHER): Payer: Medicare Other | Admitting: Internal Medicine

## 2015-07-11 VITALS — BP 138/88 | HR 77 | Temp 97.5°F | Ht 63.5 in | Wt 148.5 lb

## 2015-07-11 DIAGNOSIS — M0579 Rheumatoid arthritis with rheumatoid factor of multiple sites without organ or systems involvement: Secondary | ICD-10-CM

## 2015-07-11 DIAGNOSIS — E039 Hypothyroidism, unspecified: Secondary | ICD-10-CM

## 2015-07-11 DIAGNOSIS — Z7189 Other specified counseling: Secondary | ICD-10-CM | POA: Diagnosis not present

## 2015-07-11 DIAGNOSIS — G629 Polyneuropathy, unspecified: Secondary | ICD-10-CM | POA: Diagnosis not present

## 2015-07-11 DIAGNOSIS — Z0000 Encounter for general adult medical examination without abnormal findings: Secondary | ICD-10-CM | POA: Diagnosis not present

## 2015-07-11 LAB — COMPREHENSIVE METABOLIC PANEL
ALBUMIN: 4.1 g/dL (ref 3.5–5.2)
ALK PHOS: 50 U/L (ref 39–117)
ALT: 11 U/L (ref 0–53)
AST: 16 U/L (ref 0–37)
BUN: 24 mg/dL — ABNORMAL HIGH (ref 6–23)
CALCIUM: 9.6 mg/dL (ref 8.4–10.5)
CO2: 24 mEq/L (ref 19–32)
CREATININE: 1.44 mg/dL (ref 0.40–1.50)
Chloride: 104 mEq/L (ref 96–112)
GFR: 48.65 mL/min — AB (ref >60.00)
Glucose, Bld: 90 mg/dL (ref 70–99)
Potassium: 4.2 mEq/L (ref 3.5–5.1)
Sodium: 137 mEq/L (ref 135–145)
TOTAL PROTEIN: 7.4 g/dL (ref 6.0–8.3)
Total Bilirubin: 0.6 mg/dL (ref 0.2–1.2)

## 2015-07-11 LAB — CBC WITH DIFFERENTIAL/PLATELET
BASOS PCT: 0.5 % (ref 0.0–3.0)
Basophils Absolute: 0 10*3/uL (ref 0.0–0.1)
EOS ABS: 0.1 10*3/uL (ref 0.0–0.7)
Eosinophils Relative: 0.9 % (ref 0.0–5.0)
HEMATOCRIT: 45 % (ref 39.0–52.0)
Hemoglobin: 14.8 g/dL (ref 13.0–17.0)
LYMPHS ABS: 1.5 10*3/uL (ref 0.7–4.0)
LYMPHS PCT: 17.6 % (ref 12.0–46.0)
MCHC: 32.9 g/dL (ref 30.0–36.0)
MCV: 94.1 fl (ref 78.0–100.0)
Monocytes Absolute: 0.8 10*3/uL (ref 0.1–1.0)
Monocytes Relative: 9.7 % (ref 3.0–12.0)
NEUTROS ABS: 6.2 10*3/uL (ref 1.4–7.7)
NEUTROS PCT: 71.3 % (ref 43.0–77.0)
PLATELETS: 235 10*3/uL (ref 150.0–400.0)
RBC: 4.78 Mil/uL (ref 4.22–5.81)
RDW: 14.2 % (ref 11.5–15.5)
WBC: 8.7 10*3/uL (ref 4.0–10.5)

## 2015-07-11 LAB — TSH: TSH: 10 u[IU]/mL — ABNORMAL HIGH (ref 0.35–4.50)

## 2015-07-11 LAB — T4, FREE: Free T4: 0.74 ng/dL (ref 0.60–1.60)

## 2015-07-11 NOTE — Progress Notes (Signed)
Subjective:    Patient ID: Isaac Dixon, male    DOB: 1920-08-11, 82 y.o.   MRN: [id number]  HPI Here for Medicare wellness and follow up of chronic health conditions Reviewed form and advanced directives Reviewed other doctors--[facility] for ortho, Dr Marvel Plan --optho, [place] is dentist No tobacco Wine with dinner -- 1-2 No tobacco Still works out regularly---every day cardio, aerobic and strength No falls No depression or anhedonia Independent with instrumental ADLs No apparent cognitive problems  His shoulder is better He switched mouse to left hand---right shoulder is now better RA continues to be quiet on the enbrel Still sees Dr Precious Reel for this Left TKR redo after infection is now 82 years old and doing well  Still has numbness in feet Has done different therapy with Dr Naylor--chiropractor Has massage machine 15 minutes twice a day Not painful  Energy levels seem fine No change in hair, nails or skin  Current Outpatient Prescriptions on File Prior to Visit  Medication Sig Dispense Refill  . aspirin 81 MG tablet Take 81 mg by mouth daily.    Marland Kitchen etanercept (ENBREL) 50 MG/ML injection Inject 50 mg into the skin once a week.    . levothyroxine (SYNTHROID, LEVOTHROID) 25 MCG tablet take 1 tablet by mouth every morning ON AN EMPTY STOMACH 30 tablet 0   No current facility-administered medications on file prior to visit.    No Known Allergies  Past Medical History  Diagnosis Date  . TIA (transient ischemic attack) 1980's    negative work up  . Rheumatoid arthritis [facility])     sees Dr Jefm Bryant  . Hypothyroidism   . Osteoarthritis, knee     just left---better after TKR.    Past Surgical History  Procedure Laterality Date  . Total knee arthroplasty Left 2006    original 1999 then replaced due to infection  . Tonsillectomy and adenoidectomy  1938  . Inguinal hernia repair  2013    Family History  Problem Relation Age of Onset  . COPD Father   .  Heart disease Brother     Social History   Social History  . Marital Status: Significant Other    Spouse Name: N/A  . Number of Children: 3  . Years of Education: N/A   Occupational History  . Retired Marine scientist    Social History Main Topics  . Smoking status: Former Research scientist (life sciences)  . Smokeless tobacco: Never Used  . Alcohol Use: 0.0 oz/week    0 Standard drinks or equivalent per week  . Drug Use: No  . Sexual Activity: Not on file   Other Topics Concern  . Not on file   Social History Narrative   Widowed 5/07   Together with [name] since 2008.      Has living will   Health care POA is partner [name]. Son is alternate   Would accept attempts at resuscitation   No feeding tube if cognitively unaware   Review of Systems Sleeps well  Appetite is good Weight stable Wears seat belt Teeth okay--goes to dentist regularly Bowels are okay. No blood in stool Voids well. Stream is fine. No urgency, etc No other joint issues--he attributes this to his exercise No rash or suspicious lesions    Objective:   Physical Exam  Constitutional: He is oriented to person, place, and time. He appears well-developed and well-nourished. No distress.  HENT:  Mouth/Throat: Oropharynx is clear and moist. No oropharyngeal exudate.  Neck: Normal range of motion. Neck  supple. No thyromegaly present.  Cardiovascular: Normal rate, regular rhythm, normal heart sounds and intact distal pulses.  Exam reveals no gallop.   No murmur heard. Pulmonary/Chest: Effort normal and breath sounds normal. No respiratory distress. He has no wheezes.  Fine dry bibasilar crackles  Abdominal: Soft. Bowel sounds are normal. There is no tenderness.  Musculoskeletal: He exhibits no edema or tenderness.  Lymphadenopathy:    He has no cervical adenopathy.  Neurological: He is alert and oriented to person, place, and time.  President-- "Dwaine Deter, Bush" [phone] D-l-r-o-w Recall 3/3  Skin: Skin is warm.  No rash noted. No erythema.  Psychiatric: He has a normal mood and affect. His behavior is normal.          Assessment & Plan:

## 2015-07-11 NOTE — Assessment & Plan Note (Signed)
Seems to be euthyroid Will check labs 

## 2015-07-11 NOTE — Assessment & Plan Note (Signed)
I have personally reviewed the Medicare Annual Wellness questionnaire and have noted 1. The patient's medical and social history 2. Their use of alcohol, tobacco or illicit drugs 3. Their current medications and supplements 4. The patient's functional ability including ADL's, fall risks, home safety risks and hearing or visual             impairment. 5. Diet and physical activities 6. Evidence for depression or mood disorders  The patients weight, height, BMI and visual acuity have been recorded in the chart I have made referrals, counseling and provided education to the patient based review of the above and I have provided the pt with a written personalized care plan for preventive services.  I have provided you with a copy of your personalized plan for preventive services. Please take the time to review along with your updated medication list.  No cancer screening due to age Yearly flu shot Discussed zostavax--will hold off

## 2015-07-11 NOTE — Progress Notes (Signed)
Pre visit review using our clinic review tool, if applicable. No additional management support is needed unless otherwise documented below in the visit note. 

## 2015-07-11 NOTE — Assessment & Plan Note (Signed)
Sensory changes but no pain No action needed

## 2015-07-11 NOTE — Assessment & Plan Note (Signed)
See social history 

## 2015-07-11 NOTE — Assessment & Plan Note (Signed)
Doing great on the enbrel

## 2015-08-15 ENCOUNTER — Other Ambulatory Visit: Payer: Self-pay | Admitting: *Deleted

## 2015-08-15 MED ORDER — LEVOTHYROXINE SODIUM 25 MCG PO TABS: ORAL_TABLET | ORAL | Status: DC

## 2015-09-06 DIAGNOSIS — M0579 Rheumatoid arthritis with rheumatoid factor of multiple sites without organ or systems involvement: Secondary | ICD-10-CM | POA: Diagnosis not present

## 2015-09-06 DIAGNOSIS — G629 Polyneuropathy, unspecified: Secondary | ICD-10-CM | POA: Diagnosis not present

## 2015-09-26 DIAGNOSIS — M25562 Pain in left knee: Secondary | ICD-10-CM | POA: Diagnosis not present

## 2015-09-26 DIAGNOSIS — Z96652 Presence of left artificial knee joint: Secondary | ICD-10-CM | POA: Diagnosis not present

## 2015-09-29 ENCOUNTER — Ambulatory Visit (INDEPENDENT_AMBULATORY_CARE_PROVIDER_SITE_OTHER): Payer: Medicare Other | Admitting: Family Medicine

## 2015-09-29 ENCOUNTER — Encounter: Payer: Self-pay | Admitting: Family Medicine

## 2015-09-29 ENCOUNTER — Other Ambulatory Visit: Payer: Self-pay | Admitting: Family Medicine

## 2015-09-29 VITALS — BP 130/68 | HR 68 | Temp 98.1°F | Wt 149.0 lb

## 2015-09-29 DIAGNOSIS — E039 Hypothyroidism, unspecified: Secondary | ICD-10-CM | POA: Diagnosis not present

## 2015-09-29 DIAGNOSIS — R197 Diarrhea, unspecified: Secondary | ICD-10-CM

## 2015-09-29 LAB — CBC WITH DIFFERENTIAL/PLATELET
Basophils Absolute: 0 10*3/uL (ref 0.0–0.1)
Basophils Relative: 0.3 % (ref 0.0–3.0)
EOS ABS: 0.1 10*3/uL (ref 0.0–0.7)
Eosinophils Relative: 1.1 % (ref 0.0–5.0)
HCT: 40.7 % (ref 39.0–52.0)
HEMOGLOBIN: 13.7 g/dL (ref 13.0–17.0)
LYMPHS ABS: 1.7 10*3/uL (ref 0.7–4.0)
Lymphocytes Relative: 19.1 % (ref 12.0–46.0)
MCHC: 33.6 g/dL (ref 30.0–36.0)
MCV: 92.6 fl (ref 78.0–100.0)
MONO ABS: 0.9 10*3/uL (ref 0.1–1.0)
Monocytes Relative: 10.3 % (ref 3.0–12.0)
NEUTROS PCT: 69.2 % (ref 43.0–77.0)
Neutro Abs: 6.2 10*3/uL (ref 1.4–7.7)
Platelets: 269 10*3/uL (ref 150.0–400.0)
RBC: 4.39 Mil/uL (ref 4.22–5.81)
RDW: 14.1 % (ref 11.5–15.5)
WBC: 9 10*3/uL (ref 4.0–10.5)

## 2015-09-29 LAB — COMPREHENSIVE METABOLIC PANEL
ALBUMIN: 3.9 g/dL (ref 3.5–5.2)
ALT: 11 U/L (ref 0–53)
AST: 17 U/L (ref 0–37)
Alkaline Phosphatase: 58 U/L (ref 39–117)
BUN: 22 mg/dL (ref 6–23)
CHLORIDE: 106 meq/L (ref 96–112)
CO2: 24 mEq/L (ref 19–32)
CREATININE: 1.56 mg/dL — AB (ref 0.40–1.50)
Calcium: 9.4 mg/dL (ref 8.4–10.5)
GFR: 44.33 mL/min — ABNORMAL LOW (ref >60.00)
GLUCOSE: 88 mg/dL (ref 70–99)
POTASSIUM: 4.5 meq/L (ref 3.5–5.1)
SODIUM: 137 meq/L (ref 135–145)
Total Bilirubin: 0.7 mg/dL (ref 0.2–1.2)
Total Protein: 7.5 g/dL (ref 6.0–8.3)

## 2015-09-29 LAB — TSH: TSH: 14.51 u[IU]/mL — AB (ref 0.35–4.50)

## 2015-09-29 MED ORDER — LEVOTHYROXINE SODIUM 50 MCG PO TABS: ORAL_TABLET | ORAL | 0 refills | Status: DC

## 2015-09-29 NOTE — Progress Notes (Signed)
Pre visit review using our clinic review tool, if applicable. No additional management support is needed unless otherwise documented below in the visit note. 

## 2015-09-29 NOTE — Patient Instructions (Signed)
Please decrease your intake of high fiber foods for several days to see if that helps your stools Can continue Immodium if desired I will contact you with your lab results via [place]. If you develop any fever over 100, abdominal pain or worsening symptoms, please call the office.

## 2015-09-29 NOTE — Progress Notes (Addendum)
   Subjective:    Patient ID: Isaac Dixon, male    DOB: 04-03-1920, 82 y.o.   MRN: [id number]  HPI This is a 82 yo male who presents today with diarrhea for several weeks. He is having loose stools 2-3 times a day. Not watery. Not every day. Sleeping well. Tried some immodium and stools were somewhat firmer. No unusual color or blood. No recent fever, no abdominal pain, No urgency. No dysuria, no hematuria, no frequency. No recent travel, no sick contacts. He thinks this is caused by drinking buttermilk. He has since stopped drinking buttermilk. He drinks 4-5 fruits every morning. This is not new for him. No fatigue or weakness. No cramping or gurgling.    Past Medical History:  Diagnosis Date  . Hypothyroidism   . Osteoarthritis, knee    just left---better after TKR.  Marland Kitchen Rheumatoid arthritis [facility])    sees Dr Jefm Bryant  . TIA (transient ischemic attack) 1980's   negative work up   Past Surgical History:  Procedure Laterality Date  . INGUINAL HERNIA REPAIR  2013  . TONSILLECTOMY AND ADENOIDECTOMY  1938  . TOTAL KNEE ARTHROPLASTY Left 2006   original 1999 then replaced due to infection   Family History  Problem Relation Age of Onset  . COPD Father   . Heart disease Brother    Social History  Substance Use Topics  . Smoking status: Former Research scientist (life sciences)  . Smokeless tobacco: Never Used  . Alcohol use 0.0 oz/week     Comment: occ      Review of Systems Per HPI    Objective:   Physical Exam  Constitutional: He is oriented to person, place, and time. He appears well-developed and well-nourished. No distress.  HENT:  Head: Normocephalic and atraumatic.  Cardiovascular: Normal rate, regular rhythm and normal heart sounds.   Pulmonary/Chest: Effort normal and breath sounds normal.  Abdominal: Soft. Bowel sounds are normal. He exhibits no distension and no mass. There is no tenderness. There is no rebound and no guarding.  Neurological: He is alert and oriented to person, place, and  time.  Skin: Skin is warm and dry. He is not diaphoretic.  Psychiatric: He has a normal mood and affect. His behavior is normal. Judgment and thought content normal.  Vitals reviewed.     BP 130/68 (BP Location: Left Arm, Patient Position: Sitting, Cuff Size: Normal)   Pulse 68   Temp 98.1 F (36.7 C) (Oral)   Wt 149 lb (67.6 kg)   SpO2 95%   BMI 25.98 kg/m  Wt Readings from Last 3 Encounters:  09/29/15 149 lb (67.6 kg)  07/11/15 148 lb 8 oz (67.4 kg)  02/10/15 149 lb (67.6 kg)       Assessment & Plan:  1. Diarrhea, unspecified type - lack of associated symptoms, abnormal physical findings reassuring. Will check labs, recommended he decrease high fiber foods for a couple of days, can continue Immodium - RTC precautions reviewed - CBC with Differential/Platelet - Comprehensive metabolic panel - TSH  2. Hypothyroidism, unspecified hypothyroidism type - TSH   Clarene Reamer, FNP-BC  [place] [facility], [place]    09/29/2015 10:30 AM

## 2015-10-10 ENCOUNTER — Ambulatory Visit (INDEPENDENT_AMBULATORY_CARE_PROVIDER_SITE_OTHER): Payer: Medicare Other | Admitting: Internal Medicine

## 2015-10-10 ENCOUNTER — Encounter: Payer: Self-pay | Admitting: Internal Medicine

## 2015-10-10 VITALS — BP 116/62 | HR 63 | Temp 98.0°F | Wt 151.0 lb

## 2015-10-10 DIAGNOSIS — E039 Hypothyroidism, unspecified: Secondary | ICD-10-CM | POA: Diagnosis not present

## 2015-10-10 DIAGNOSIS — K591 Functional diarrhea: Secondary | ICD-10-CM

## 2015-10-10 DIAGNOSIS — R197 Diarrhea, unspecified: Secondary | ICD-10-CM | POA: Insufficient documentation

## 2015-10-10 NOTE — Progress Notes (Signed)
   Subjective:    Patient ID: Robt Gunnell, male    DOB: 1920-04-29, 82 y.o.   MRN: [id number]  HPI Here for follow up of diarrhea With wife  Has had diarrhea for 5 weeks or so Wondered if it was related to buttermilk--but has cut this out Starting to improve finally--mostly down to once a day Using the imodium with each loose stool--just about daily now  Appetite is great No blood in stool  Current Outpatient Prescriptions on File Prior to Visit  Medication Sig Dispense Refill  . aspirin 81 MG tablet Take 81 mg by mouth daily.    Marland Kitchen etanercept (ENBREL) 50 MG/ML injection Inject 50 mg into the skin once a week.    . levothyroxine (SYNTHROID, LEVOTHROID) 50 MCG tablet take 1 tablet by mouth every morning ON AN EMPTY STOMACH. 90 tablet 0   No current facility-administered medications on file prior to visit.     No Known Allergies  Past Medical History:  Diagnosis Date  . Hypothyroidism   . Osteoarthritis, knee    just left---better after TKR.  Marland Kitchen Rheumatoid arthritis [facility])    sees Dr Jefm Bryant  . TIA (transient ischemic attack) 1980's   negative work up    Past Surgical History:  Procedure Laterality Date  . INGUINAL HERNIA REPAIR  2013  . TONSILLECTOMY AND ADENOIDECTOMY  1938  . TOTAL KNEE ARTHROPLASTY Left 2006   original 1999 then replaced due to infection    Family History  Problem Relation Age of Onset  . COPD Father   . Heart disease Brother     Social History   Social History  . Marital status: Significant Other    Spouse name: N/A  . Number of children: 3  . Years of education: N/A   Occupational History  . Retired Marine scientist    Social History Main Topics  . Smoking status: Former Research scientist (life sciences)  . Smokeless tobacco: Never Used  . Alcohol use 0.0 oz/week     Comment: occ  . Drug use: No  . Sexual activity: Not on file   Other Topics Concern  . Not on file   Social History Narrative   Widowed 5/07   Together with [name] since 2008.      Has  living will   Health care POA is partner [name]. Son is alternate   Would accept attempts at resuscitation   No feeding tube if cognitively unaware   Review of Systems Did increase levothyroxine as instructed Hasn't lost weight    Objective:   Physical Exam  Pulmonary/Chest: Effort normal and breath sounds normal. No respiratory distress. He has no wheezes. He has no rales.  Abdominal: Soft. He exhibits no distension. There is no tenderness. There is no rebound and no guarding.          Assessment & Plan:

## 2015-10-10 NOTE — Progress Notes (Signed)
Pre visit review using our clinic review tool, if applicable. No additional management support is needed unless otherwise documented below in the visit note. 

## 2015-10-10 NOTE — Assessment & Plan Note (Signed)
Improved Not sure what was the cause but didn't seem to be infectious. Will have him try probiotic Limit imodium

## 2015-10-10 NOTE — Patient Instructions (Signed)
Please try a probiotic daily till your bowels are normal (can use yogurt like activia, or a tablet). Please use the imodium only if you have more than 1 loose stool.

## 2015-10-19 DIAGNOSIS — Z23 Encounter for immunization: Secondary | ICD-10-CM | POA: Diagnosis not present

## 2015-11-21 ENCOUNTER — Other Ambulatory Visit (INDEPENDENT_AMBULATORY_CARE_PROVIDER_SITE_OTHER): Payer: Medicare Other

## 2015-11-21 DIAGNOSIS — E039 Hypothyroidism, unspecified: Secondary | ICD-10-CM

## 2015-11-21 DIAGNOSIS — E038 Other specified hypothyroidism: Secondary | ICD-10-CM

## 2015-11-21 LAB — T4, FREE: FREE T4: 0.77 ng/dL (ref 0.60–1.60)

## 2015-11-21 LAB — TSH: TSH: 6.87 u[IU]/mL — AB (ref 0.35–4.50)

## 2015-12-13 DIAGNOSIS — R278 Other lack of coordination: Secondary | ICD-10-CM | POA: Diagnosis not present

## 2015-12-13 DIAGNOSIS — R4189 Other symptoms and signs involving cognitive functions and awareness: Secondary | ICD-10-CM | POA: Diagnosis not present

## 2015-12-15 DIAGNOSIS — R278 Other lack of coordination: Secondary | ICD-10-CM | POA: Diagnosis not present

## 2015-12-15 DIAGNOSIS — R4189 Other symptoms and signs involving cognitive functions and awareness: Secondary | ICD-10-CM | POA: Diagnosis not present

## 2015-12-21 DIAGNOSIS — R4181 Age-related cognitive decline: Secondary | ICD-10-CM | POA: Diagnosis not present

## 2015-12-21 DIAGNOSIS — R41840 Attention and concentration deficit: Secondary | ICD-10-CM | POA: Diagnosis not present

## 2015-12-21 DIAGNOSIS — R4189 Other symptoms and signs involving cognitive functions and awareness: Secondary | ICD-10-CM | POA: Diagnosis not present

## 2015-12-27 DIAGNOSIS — R41840 Attention and concentration deficit: Secondary | ICD-10-CM | POA: Diagnosis not present

## 2015-12-27 DIAGNOSIS — R4181 Age-related cognitive decline: Secondary | ICD-10-CM | POA: Diagnosis not present

## 2015-12-27 DIAGNOSIS — R4189 Other symptoms and signs involving cognitive functions and awareness: Secondary | ICD-10-CM | POA: Diagnosis not present

## 2015-12-30 ENCOUNTER — Other Ambulatory Visit: Payer: Self-pay | Admitting: Family Medicine

## 2016-01-03 ENCOUNTER — Telehealth: Payer: Self-pay | Admitting: Internal Medicine

## 2016-01-03 NOTE — Telephone Encounter (Signed)
Order stamp signed and faxed back

## 2016-01-03 NOTE — Telephone Encounter (Signed)
Spoke to [facility]. She is faxing me a form

## 2016-01-03 NOTE — Telephone Encounter (Signed)
Please call the independent nurses to give order to clean ears

## 2016-01-03 NOTE — Telephone Encounter (Signed)
Pt called about his ears getting cleaned of wax buildup. Pt said [facility] nurse clinic needs orders to be able to clean. Call with any questions.

## 2016-01-05 ENCOUNTER — Telehealth: Payer: Self-pay | Admitting: *Deleted

## 2016-01-05 NOTE — Telephone Encounter (Signed)
PTs significant other came in and asked that someone call the [facility] to have Mr. Ackroyd earwax removed. They will not do the clean out without a call from someone in the office. They can be reached at [phone].

## 2016-01-05 NOTE — Telephone Encounter (Signed)
Advised her here in office that we have already done the order. She said they had not checked with them today.

## 2016-03-13 DIAGNOSIS — R200 Anesthesia of skin: Secondary | ICD-10-CM | POA: Diagnosis not present

## 2016-03-13 DIAGNOSIS — R202 Paresthesia of skin: Secondary | ICD-10-CM | POA: Diagnosis not present

## 2016-03-21 DIAGNOSIS — G629 Polyneuropathy, unspecified: Secondary | ICD-10-CM | POA: Diagnosis not present

## 2016-03-21 DIAGNOSIS — M0579 Rheumatoid arthritis with rheumatoid factor of multiple sites without organ or systems involvement: Secondary | ICD-10-CM | POA: Diagnosis not present

## 2016-05-24 DIAGNOSIS — Z96652 Presence of left artificial knee joint: Secondary | ICD-10-CM | POA: Diagnosis not present

## 2016-09-28 DIAGNOSIS — Z23 Encounter for immunization: Secondary | ICD-10-CM | POA: Diagnosis not present

## 2016-10-04 ENCOUNTER — Encounter: Payer: Self-pay | Admitting: Internal Medicine

## 2016-12-05 ENCOUNTER — Ambulatory Visit (INDEPENDENT_AMBULATORY_CARE_PROVIDER_SITE_OTHER): Payer: Medicare Other | Admitting: Internal Medicine

## 2016-12-05 ENCOUNTER — Encounter: Payer: Self-pay | Admitting: Internal Medicine

## 2016-12-05 VITALS — BP 132/70 | HR 68 | Temp 97.8°F | Ht 63.5 in | Wt 145.5 lb

## 2016-12-05 DIAGNOSIS — M0579 Rheumatoid arthritis with rheumatoid factor of multiple sites without organ or systems involvement: Secondary | ICD-10-CM

## 2016-12-05 DIAGNOSIS — E038 Other specified hypothyroidism: Secondary | ICD-10-CM | POA: Diagnosis not present

## 2016-12-05 DIAGNOSIS — Z0000 Encounter for general adult medical examination without abnormal findings: Secondary | ICD-10-CM | POA: Diagnosis not present

## 2016-12-05 DIAGNOSIS — G629 Polyneuropathy, unspecified: Secondary | ICD-10-CM

## 2016-12-05 DIAGNOSIS — Z7189 Other specified counseling: Secondary | ICD-10-CM | POA: Diagnosis not present

## 2016-12-05 LAB — T4, FREE: FREE T4: 0.89 ng/dL (ref 0.60–1.60)

## 2016-12-05 LAB — CBC
HEMATOCRIT: 45.2 % (ref 39.0–52.0)
Hemoglobin: 14.8 g/dL (ref 13.0–17.0)
MCHC: 32.8 g/dL (ref 30.0–36.0)
MCV: 96.1 fl (ref 78.0–100.0)
Platelets: 249 10*3/uL (ref 150.0–400.0)
RBC: 4.7 Mil/uL (ref 4.22–5.81)
RDW: 13.8 % (ref 11.5–15.5)
WBC: 6.7 10*3/uL (ref 4.0–10.5)

## 2016-12-05 LAB — COMPREHENSIVE METABOLIC PANEL
ALBUMIN: 4 g/dL (ref 3.5–5.2)
ALT: 11 U/L (ref 0–53)
AST: 16 U/L (ref 0–37)
Alkaline Phosphatase: 59 U/L (ref 39–117)
BILIRUBIN TOTAL: 0.5 mg/dL (ref 0.2–1.2)
BUN: 25 mg/dL — ABNORMAL HIGH (ref 6–23)
CALCIUM: 9.8 mg/dL (ref 8.4–10.5)
CHLORIDE: 105 meq/L (ref 96–112)
CO2: 27 mEq/L (ref 19–32)
CREATININE: 1.66 mg/dL — AB (ref 0.40–1.50)
GFR: 41.16 mL/min — ABNORMAL LOW (ref >60.00)
Glucose, Bld: 102 mg/dL — ABNORMAL HIGH (ref 70–99)
Potassium: 5.6 mEq/L — ABNORMAL HIGH (ref 3.5–5.1)
Sodium: 139 mEq/L (ref 135–145)
Total Protein: 7.6 g/dL (ref 6.0–8.3)

## 2016-12-05 LAB — TSH: TSH: 6.54 u[IU]/mL — AB (ref 0.35–4.50)

## 2016-12-05 NOTE — Assessment & Plan Note (Signed)
I have personally reviewed the Medicare Annual Wellness questionnaire and have noted 1. The patient's medical and social history 2. Their use of alcohol, tobacco or illicit drugs 3. Their current medications and supplements 4. The patient's functional ability including ADL's, fall risks, home safety risks and hearing or visual             impairment. 5. Diet and physical activities 6. Evidence for depression or mood disorders  The patients weight, height, BMI and visual acuity have been recorded in the chart I have made referrals, counseling and provided education to the patient based review of the above and I have provided the pt with a written personalized care plan for preventive services.  I have provided you with a copy of your personalized plan for preventive services. Please take the time to review along with your updated medication list.  Stays fit Got flu vaccine No cancer screening

## 2016-12-05 NOTE — Assessment & Plan Note (Signed)
Considering DNR but not sure

## 2016-12-05 NOTE — Progress Notes (Signed)
Subjective:    Patient ID: Isaac Dixon, male    DOB: 1920-06-19, 83 y.o.   MRN: [id number]  HPI Here for Medicare wellness and follow up of chronic health conditions With wife (common law) Reviewed form and advanced directives Reviewed other doctors Still enjoys occasional wine before dinner--just 1 No tobacco Exercises regularly Vision is fine Uses hearing aides sporadically No depression or anhedonia Independent with instrumental ADLs--gave up driving at wife's insistence Minor memory problems--like forgetting cards at bridge  Ongoing neuropathy Mostly just numbness--not really painful Has fallen multiple times without injury Discussed need to use cane to keep balance (can use walking stick)  Continues on the thyroid No apparent problems Energy levels are good  Continues on embrel--- from Dr Jefm Bryant Controls his RA well  Still takes the asa No neurologic symptoms recently  Current Outpatient Prescriptions on File Prior to Visit  Medication Sig Dispense Refill  . aspirin 81 MG tablet Take 81 mg by mouth daily.    Marland Kitchen etanercept (ENBREL) 50 MG/ML injection Inject 50 mg into the skin once a week.    . levothyroxine (SYNTHROID, LEVOTHROID) 50 MCG tablet take 1 tablet by mouth every morning ON AN EMPTY STOMACH 90 tablet 3   No current facility-administered medications on file prior to visit.     No Known Allergies  Past Medical History:  Diagnosis Date  . Hypothyroidism   . Osteoarthritis, knee    just left---better after TKR.  Marland Kitchen Rheumatoid arthritis [facility])    sees Dr Jefm Bryant  . TIA (transient ischemic attack) 1980's   negative work up    Past Surgical History:  Procedure Laterality Date  . INGUINAL HERNIA REPAIR  2013  . TONSILLECTOMY AND ADENOIDECTOMY  1938  . TOTAL KNEE ARTHROPLASTY Left 2006   original 1999 then replaced due to infection    Family History  Problem Relation Age of Onset  . COPD Father   . Heart disease Brother     Social History     Social History  . Marital status: Significant Other    Spouse name: N/A  . Number of children: 3  . Years of education: N/A   Occupational History  . Retired Marine scientist    Social History Main Topics  . Smoking status: Former Research scientist (life sciences)  . Smokeless tobacco: Never Used  . Alcohol use 0.0 oz/week     Comment: occ  . Drug use: No  . Sexual activity: Not on file   Other Topics Concern  . Not on file   Social History Narrative   Widowed 5/07   Together with [name] since 2008.      Has living will   Health care POA is partner [name]. Son is alternate   Would accept attempts at resuscitation   No feeding tube if cognitively unaware   Review of Systems Sleeps great Appetite is good Weight is stable Bowels are fine---no blood Voids fine. Stream is fine. Nocturia x 2 usually Wears seat belt Teeth are fine---regular with dentist No heartburn or dysphagia No skin problems    Objective:   Physical Exam  Constitutional: He is oriented to person, place, and time. He appears well-developed. No distress.  HENT:  Mouth/Throat: Oropharynx is clear and moist. No oropharyngeal exudate.  Neck: No thyromegaly present.  Cardiovascular: Normal rate, regular rhythm and normal heart sounds.  Exam reveals no gallop.   No murmur heard. Faint pedal pulses  Pulmonary/Chest: Effort normal and breath sounds normal. No respiratory distress. He has no  wheezes. He has no rales.  Abdominal: Soft. There is no tenderness.  Musculoskeletal: He exhibits no edema or tenderness.  No active synovitis but some ulnar deviation in right hand  Lymphadenopathy:    He has no cervical adenopathy.  Neurological: He is alert and oriented to person, place, and time.  President-- "Dwaine Deter, Bush" [phone] D-l-r-o-w Recall 3/3  Skin: No rash noted. No erythema.  Psychiatric: He has a normal mood and affect. His behavior is normal.          Assessment & Plan:

## 2016-12-05 NOTE — Assessment & Plan Note (Signed)
Main issue for him Discussed using walking stick for balance

## 2016-12-05 NOTE — Assessment & Plan Note (Signed)
Controlled with enbrel Due for follow up with Dr Jefm Bryant soon

## 2016-12-05 NOTE — Assessment & Plan Note (Signed)
Seems euthyroid Due for labs 

## 2016-12-06 ENCOUNTER — Other Ambulatory Visit: Payer: Self-pay | Admitting: Internal Medicine

## 2016-12-06 DIAGNOSIS — E875 Hyperkalemia: Secondary | ICD-10-CM

## 2016-12-10 ENCOUNTER — Other Ambulatory Visit (INDEPENDENT_AMBULATORY_CARE_PROVIDER_SITE_OTHER): Payer: Medicare Other

## 2016-12-10 DIAGNOSIS — E875 Hyperkalemia: Secondary | ICD-10-CM | POA: Diagnosis not present

## 2016-12-10 LAB — RENAL FUNCTION PANEL
ALBUMIN: 4 g/dL (ref 3.5–5.2)
BUN: 26 mg/dL — ABNORMAL HIGH (ref 6–23)
CALCIUM: 9.7 mg/dL (ref 8.4–10.5)
CHLORIDE: 105 meq/L (ref 96–112)
CO2: 26 mEq/L (ref 19–32)
Creatinine, Ser: 1.56 mg/dL — ABNORMAL HIGH (ref 0.40–1.50)
GFR: 44.22 mL/min — ABNORMAL LOW (ref >60.00)
GLUCOSE: 102 mg/dL — AB (ref 70–99)
POTASSIUM: 4.8 meq/L (ref 3.5–5.1)
Phosphorus: 3.8 mg/dL (ref 2.3–4.6)
Sodium: 138 mEq/L (ref 135–145)

## 2017-01-01 DIAGNOSIS — Z96652 Presence of left artificial knee joint: Secondary | ICD-10-CM | POA: Diagnosis not present

## 2017-01-07 ENCOUNTER — Other Ambulatory Visit: Payer: Self-pay | Admitting: Internal Medicine

## 2017-02-21 DIAGNOSIS — D229 Melanocytic nevi, unspecified: Secondary | ICD-10-CM | POA: Diagnosis not present

## 2017-02-21 DIAGNOSIS — B359 Dermatophytosis, unspecified: Secondary | ICD-10-CM | POA: Diagnosis not present

## 2017-02-21 DIAGNOSIS — L219 Seborrheic dermatitis, unspecified: Secondary | ICD-10-CM | POA: Diagnosis not present

## 2017-02-21 DIAGNOSIS — L821 Other seborrheic keratosis: Secondary | ICD-10-CM | POA: Diagnosis not present

## 2017-02-21 DIAGNOSIS — L570 Actinic keratosis: Secondary | ICD-10-CM | POA: Diagnosis not present

## 2017-03-21 DIAGNOSIS — L570 Actinic keratosis: Secondary | ICD-10-CM | POA: Diagnosis not present

## 2017-04-04 DIAGNOSIS — L570 Actinic keratosis: Secondary | ICD-10-CM | POA: Diagnosis not present

## 2017-04-15 DIAGNOSIS — R202 Paresthesia of skin: Secondary | ICD-10-CM | POA: Diagnosis not present

## 2017-04-15 DIAGNOSIS — R200 Anesthesia of skin: Secondary | ICD-10-CM | POA: Diagnosis not present

## 2017-04-23 DIAGNOSIS — M6281 Muscle weakness (generalized): Secondary | ICD-10-CM | POA: Diagnosis not present

## 2017-04-23 DIAGNOSIS — R2681 Unsteadiness on feet: Secondary | ICD-10-CM | POA: Diagnosis not present

## 2017-04-29 DIAGNOSIS — R2681 Unsteadiness on feet: Secondary | ICD-10-CM | POA: Diagnosis not present

## 2017-04-29 DIAGNOSIS — M6281 Muscle weakness (generalized): Secondary | ICD-10-CM | POA: Diagnosis not present

## 2017-05-02 DIAGNOSIS — L219 Seborrheic dermatitis, unspecified: Secondary | ICD-10-CM | POA: Diagnosis not present

## 2017-05-02 DIAGNOSIS — L570 Actinic keratosis: Secondary | ICD-10-CM | POA: Diagnosis not present

## 2017-05-03 DIAGNOSIS — M6281 Muscle weakness (generalized): Secondary | ICD-10-CM | POA: Diagnosis not present

## 2017-05-03 DIAGNOSIS — R2681 Unsteadiness on feet: Secondary | ICD-10-CM | POA: Diagnosis not present

## 2017-05-06 DIAGNOSIS — R2681 Unsteadiness on feet: Secondary | ICD-10-CM | POA: Diagnosis not present

## 2017-05-06 DIAGNOSIS — M6281 Muscle weakness (generalized): Secondary | ICD-10-CM | POA: Diagnosis not present

## 2017-05-08 DIAGNOSIS — M6281 Muscle weakness (generalized): Secondary | ICD-10-CM | POA: Diagnosis not present

## 2017-05-08 DIAGNOSIS — R2681 Unsteadiness on feet: Secondary | ICD-10-CM | POA: Diagnosis not present

## 2017-05-10 DIAGNOSIS — R2681 Unsteadiness on feet: Secondary | ICD-10-CM | POA: Diagnosis not present

## 2017-05-10 DIAGNOSIS — M6281 Muscle weakness (generalized): Secondary | ICD-10-CM | POA: Diagnosis not present

## 2017-05-13 DIAGNOSIS — R2681 Unsteadiness on feet: Secondary | ICD-10-CM | POA: Diagnosis not present

## 2017-05-13 DIAGNOSIS — M6281 Muscle weakness (generalized): Secondary | ICD-10-CM | POA: Diagnosis not present

## 2017-05-15 DIAGNOSIS — R2681 Unsteadiness on feet: Secondary | ICD-10-CM | POA: Diagnosis not present

## 2017-05-15 DIAGNOSIS — M6281 Muscle weakness (generalized): Secondary | ICD-10-CM | POA: Diagnosis not present

## 2017-05-16 DIAGNOSIS — M6281 Muscle weakness (generalized): Secondary | ICD-10-CM | POA: Diagnosis not present

## 2017-05-16 DIAGNOSIS — R2681 Unsteadiness on feet: Secondary | ICD-10-CM | POA: Diagnosis not present

## 2017-05-20 DIAGNOSIS — G629 Polyneuropathy, unspecified: Secondary | ICD-10-CM | POA: Diagnosis not present

## 2017-05-20 DIAGNOSIS — R2681 Unsteadiness on feet: Secondary | ICD-10-CM | POA: Diagnosis not present

## 2017-05-20 DIAGNOSIS — M6281 Muscle weakness (generalized): Secondary | ICD-10-CM | POA: Diagnosis not present

## 2017-05-22 DIAGNOSIS — G629 Polyneuropathy, unspecified: Secondary | ICD-10-CM | POA: Diagnosis not present

## 2017-05-22 DIAGNOSIS — M6281 Muscle weakness (generalized): Secondary | ICD-10-CM | POA: Diagnosis not present

## 2017-05-22 DIAGNOSIS — R2681 Unsteadiness on feet: Secondary | ICD-10-CM | POA: Diagnosis not present

## 2017-05-27 DIAGNOSIS — M6281 Muscle weakness (generalized): Secondary | ICD-10-CM | POA: Diagnosis not present

## 2017-05-27 DIAGNOSIS — R2681 Unsteadiness on feet: Secondary | ICD-10-CM | POA: Diagnosis not present

## 2017-05-27 DIAGNOSIS — G629 Polyneuropathy, unspecified: Secondary | ICD-10-CM | POA: Diagnosis not present

## 2017-05-29 DIAGNOSIS — M6281 Muscle weakness (generalized): Secondary | ICD-10-CM | POA: Diagnosis not present

## 2017-05-29 DIAGNOSIS — R2681 Unsteadiness on feet: Secondary | ICD-10-CM | POA: Diagnosis not present

## 2017-05-29 DIAGNOSIS — G629 Polyneuropathy, unspecified: Secondary | ICD-10-CM | POA: Diagnosis not present

## 2017-05-30 DIAGNOSIS — G629 Polyneuropathy, unspecified: Secondary | ICD-10-CM | POA: Diagnosis not present

## 2017-05-30 DIAGNOSIS — R2681 Unsteadiness on feet: Secondary | ICD-10-CM | POA: Diagnosis not present

## 2017-05-30 DIAGNOSIS — M6281 Muscle weakness (generalized): Secondary | ICD-10-CM | POA: Diagnosis not present

## 2017-06-20 ENCOUNTER — Ambulatory Visit (INDEPENDENT_AMBULATORY_CARE_PROVIDER_SITE_OTHER)
Admission: RE | Admit: 2017-06-20 | Discharge: 2017-06-20 | Disposition: A | Discharge status: Home / Self Care | Payer: Medicare Other | Source: Ambulatory Visit | Attending: Family Medicine | Admitting: Family Medicine

## 2017-06-20 ENCOUNTER — Encounter: Payer: Self-pay | Admitting: Family Medicine

## 2017-06-20 ENCOUNTER — Ambulatory Visit (INDEPENDENT_AMBULATORY_CARE_PROVIDER_SITE_OTHER): Payer: Medicare Other | Admitting: Family Medicine

## 2017-06-20 VITALS — BP 144/68 | HR 69 | Temp 97.6°F | Ht 63.5 in | Wt 143.2 lb

## 2017-06-20 DIAGNOSIS — R058 Other specified cough: Secondary | ICD-10-CM

## 2017-06-20 DIAGNOSIS — R05 Cough: Secondary | ICD-10-CM

## 2017-06-20 DIAGNOSIS — R059 Cough, unspecified: Secondary | ICD-10-CM | POA: Insufficient documentation

## 2017-06-20 NOTE — Patient Instructions (Signed)
Get an expectorant - mucinex or robitussin to help loosen up the phlegm  Drink more water to help it work   Let's get a chest xray now -we will call you with a report   Update if not starting to improve in a week or if worsening   Especially if wheezing or shortness of breath or fever

## 2017-06-20 NOTE — Progress Notes (Signed)
Subjective:    Patient ID: Isaac Dixon, male    DOB: [birth year], 84 y.o.   MRN: [id number]  HPI Here with chest congestion   He has always had more phlegm than average  Last few days- more thick and heavy - hard to get it cleared  Coughs at night  Feels like it is mostly in his throat   No hx of allergies   Mucous is light brown in color  No wheezing or sob   No fever  No nasal or ear symptoms  Throat is not sore    Smoked age 33-age 42 (quit a long time ago)   No hx of copd    Feels ok overall   Takes enbrel for RA  Pulse ox is 95% Temp: 97.6 F (36.4 C)   Perhaps does not drink enough water  No otc medicines    Patient Active Problem List   Diagnosis Date Noted  . Productive cough 06/20/2017  . Preventative health care 07/06/2014  . Peripheral neuropathy 07/06/2014  . Advance directive discussed with patient 07/06/2014  . TIA (transient ischemic attack)   . Rheumatoid arthritis ([place])   . Hypothyroidism    Past Medical History:  Diagnosis Date  . Hypothyroidism   . Osteoarthritis, knee    just left---better after TKR.  Marland Kitchen Rheumatoid arthritis [facility])    sees Dr Jefm Bryant  . TIA (transient ischemic attack) 1980's   negative work up   Past Surgical History:  Procedure Laterality Date  . INGUINAL HERNIA REPAIR  2013  . TONSILLECTOMY AND ADENOIDECTOMY  1938  . TOTAL KNEE ARTHROPLASTY Left 2006   original 1999 then replaced due to infection   Social History   Tobacco Use  . Smoking status: Former Research scientist (life sciences)  . Smokeless tobacco: Never Used  Substance Use Topics  . Alcohol use: Yes    Alcohol/week: 0.0 oz    Comment: occ  . Drug use: No   Family History  Problem Relation Age of Onset  . COPD Father   . Heart disease Brother    No Known Allergies Current Outpatient Medications on File Prior to Visit  Medication Sig Dispense Refill  . aspirin 81 MG tablet Take 81 mg by mouth daily.    Marland Kitchen etanercept (ENBREL) 50 MG/ML injection Inject 50 mg into  the skin once a week.    . levothyroxine (SYNTHROID, LEVOTHROID) 50 MCG tablet take 1 tablet by mouth every morning ON AN EMPTY STOMACH 90 tablet 3  . Naproxen Sod-diphenhydrAMINE (ALEVE PM PO) Take 2 tablets by mouth at bedtime.     No current facility-administered medications on file prior to visit.     Review of Systems  Constitutional: Negative for activity change, appetite change, fatigue, fever and unexpected weight change.  HENT: Negative for congestion, rhinorrhea, sore throat and trouble swallowing.   Eyes: Negative for pain, redness, itching and visual disturbance.  Respiratory: Positive for cough. Negative for chest tightness, shortness of breath, wheezing and stridor.   Cardiovascular: Negative for chest pain and palpitations.  Gastrointestinal: Negative for abdominal pain, blood in stool, constipation, diarrhea and nausea.  Endocrine: Negative for cold intolerance, heat intolerance, polydipsia and polyuria.  Genitourinary: Negative for difficulty urinating, dysuria, frequency and urgency.  Musculoskeletal: Positive for arthralgias. Negative for joint swelling and myalgias.  Skin: Negative for pallor and rash.  Neurological: Negative for dizziness, tremors, weakness, numbness and headaches.  Hematological: Negative for adenopathy. Does not bruise/bleed easily.  Psychiatric/Behavioral: Negative for decreased concentration and dysphoric  mood. The patient is not nervous/anxious.        Objective:   Physical Exam  Constitutional: He appears well-developed and well-nourished. No distress.  Well appearing elderly male -looks younger than stated age   HENT:  Head: Normocephalic and atraumatic.  Nose: Nose normal.  Mouth/Throat: Oropharynx is clear and moist.  Scant clear pnd  Eyes: Pupils are equal, round, and reactive to light. EOM are normal. Right eye exhibits no discharge. Left eye exhibits no discharge. No scleral icterus.  Neck: Normal range of motion.  Cardiovascular:  Normal rate and regular rhythm.  Pulmonary/Chest: Effort normal and breath sounds normal. No stridor. No respiratory distress. He has no wheezes. He has no rales. He exhibits no tenderness.  Scant dry sounding crackles at bilat bases  No rales/rhonhi or wheeze Good air exch  Musculoskeletal:  Kyphosis   Skin: Skin is warm and dry. No rash noted. He is not diaphoretic.  Psychiatric:  Pleasant           Assessment & Plan:   Problem List Items Addressed This Visit      Other   Productive cough - Primary    Few crackles heard at lung bases-no fluid or infiltrate seen on cxr  Otherwise reassuring and pt is not ill appearing  Poss early bronchitis  Adv trial of expectorant otc -mucinex or robitussin with lots of fluids Watch for fever /sob or worse cough and update  Watch closely due to immunodef state from Enbrel       Relevant Orders   DG Chest 2 View (Completed)

## 2017-06-20 NOTE — Assessment & Plan Note (Signed)
Few crackles heard at lung bases-no fluid or infiltrate seen on cxr  Otherwise reassuring and pt is not ill appearing  Poss early bronchitis  Adv trial of expectorant otc -mucinex or robitussin with lots of fluids Watch for fever /sob or worse cough and update  Watch closely due to immunodef state from Enbrel

## 2017-06-26 DIAGNOSIS — L570 Actinic keratosis: Secondary | ICD-10-CM | POA: Diagnosis not present

## 2017-07-23 DIAGNOSIS — R202 Paresthesia of skin: Secondary | ICD-10-CM | POA: Diagnosis not present

## 2017-07-23 DIAGNOSIS — R200 Anesthesia of skin: Secondary | ICD-10-CM | POA: Diagnosis not present

## 2017-08-08 DIAGNOSIS — L570 Actinic keratosis: Secondary | ICD-10-CM | POA: Diagnosis not present

## 2017-09-30 ENCOUNTER — Encounter: Payer: Self-pay | Admitting: Family Medicine

## 2017-09-30 ENCOUNTER — Ambulatory Visit (INDEPENDENT_AMBULATORY_CARE_PROVIDER_SITE_OTHER): Payer: Medicare Other | Admitting: Family Medicine

## 2017-09-30 VITALS — BP 128/78 | HR 72 | Temp 97.6°F | Ht 63.5 in | Wt 140.2 lb

## 2017-09-30 DIAGNOSIS — N3000 Acute cystitis without hematuria: Secondary | ICD-10-CM | POA: Diagnosis not present

## 2017-09-30 DIAGNOSIS — N390 Urinary tract infection, site not specified: Secondary | ICD-10-CM | POA: Insufficient documentation

## 2017-09-30 DIAGNOSIS — R300 Dysuria: Secondary | ICD-10-CM

## 2017-09-30 LAB — POC URINALSYSI DIPSTICK (AUTOMATED)
BILIRUBIN UA: NEGATIVE
Blood, UA: 80
Glucose, UA: NEGATIVE
KETONES UA: NEGATIVE
Nitrite, UA: POSITIVE
PROTEIN UA: POSITIVE — AB
Spec Grav, UA: 1.02 (ref 1.010–1.025)
Urobilinogen, UA: 0.2 E.U./dL
pH, UA: 6 (ref 5.0–8.0)

## 2017-09-30 MED ORDER — CEPHALEXIN 250 MG PO CAPS: 250.0000 mg | ORAL_CAPSULE | Freq: Two times a day (BID) | ORAL | 0 refills | Status: DC

## 2017-09-30 NOTE — Progress Notes (Signed)
Subjective:    Patient ID: Isaac Dixon, male    DOB: [birth year], 84 y.o.   MRN: [id number]  HPI Here for urinary symptoms- dysuria and cloudy urine    84 yo pt of Dr Silvio Pate He takes enbrel for RA  Symptoms for over a week  Burning to urinate  Cloudy urine -no blood  Frequency on and off at night  No urgency   No bladder discomfort  No nausea or fever   Had utis years ago  No prostate problems     UA is pos  Results for orders placed or performed in visit on 09/30/17  POCT Urinalysis Dipstick (Automated)  Result Value Ref Range   Color, UA Yellow    Clarity, UA Cloudy    Glucose, UA Negative Negative   Bilirubin, UA Negative    Ketones, UA Negative    Spec Grav, UA 1.020 1.010 - 1.025   Blood, UA 80 Ery/uL    pH, UA 6.0 5.0 - 8.0   Protein, UA Positive (A) Negative   Urobilinogen, UA 0.2 0.2 or 1.0 E.U./dL   Nitrite, UA Positive    Leukocytes, UA Large (3+) (A) Negative    Patient Active Problem List   Diagnosis Date Noted  . UTI (urinary tract infection) 09/30/2017  . Productive cough 06/20/2017  . Preventative health care 07/06/2014  . Peripheral neuropathy 07/06/2014  . Advance directive discussed with patient 07/06/2014  . TIA (transient ischemic attack)   . Rheumatoid arthritis ([place])   . Hypothyroidism    Past Medical History:  Diagnosis Date  . Hypothyroidism   . Osteoarthritis, knee    just left---better after TKR.  Marland Kitchen Rheumatoid arthritis [facility])    sees Dr Jefm Bryant  . TIA (transient ischemic attack) 1980's   negative work up   Past Surgical History:  Procedure Laterality Date  . INGUINAL HERNIA REPAIR  2013  . TONSILLECTOMY AND ADENOIDECTOMY  1938  . TOTAL KNEE ARTHROPLASTY Left 2006   original 1999 then replaced due to infection   Social History   Tobacco Use  . Smoking status: Former Research scientist (life sciences)  . Smokeless tobacco: Never Used  Substance Use Topics  . Alcohol use: Yes    Alcohol/week: 0.0 standard drinks    Comment: occ  . Drug use:  No   Family History  Problem Relation Age of Onset  . COPD Father   . Heart disease Brother    No Known Allergies Current Outpatient Medications on File Prior to Visit  Medication Sig Dispense Refill  . aspirin 81 MG tablet Take 81 mg by mouth daily.    Marland Kitchen etanercept (ENBREL) 50 MG/ML injection Inject 50 mg into the skin once a week.    . levothyroxine (SYNTHROID, LEVOTHROID) 50 MCG tablet take 1 tablet by mouth every morning ON AN EMPTY STOMACH 90 tablet 3  . Naproxen Sod-diphenhydrAMINE (ALEVE PM PO) Take 2 tablets by mouth at bedtime.     No current facility-administered medications on file prior to visit.     Review of Systems  Constitutional: Negative for activity change, appetite change, fatigue, fever and unexpected weight change.  HENT: Negative for congestion, rhinorrhea, sore throat and trouble swallowing.   Eyes: Negative for pain, redness, itching and visual disturbance.  Respiratory: Negative for cough, chest tightness, shortness of breath and wheezing.   Cardiovascular: Negative for chest pain and palpitations.  Gastrointestinal: Negative for abdominal pain, blood in stool, constipation, diarrhea and nausea.  Endocrine: Negative for cold intolerance, heat intolerance, polydipsia  and polyuria.  Genitourinary: Positive for dysuria and frequency. Negative for decreased urine volume, difficulty urinating, flank pain, hematuria and urgency.  Musculoskeletal: Negative for arthralgias, joint swelling and myalgias.  Skin: Negative for pallor and rash.  Neurological: Negative for dizziness, tremors, weakness, numbness and headaches.  Hematological: Negative for adenopathy. Does not bruise/bleed easily.  Psychiatric/Behavioral: Negative for decreased concentration and dysphoric mood. The patient is not nervous/anxious.        Objective:   Physical Exam  Constitutional: He appears well-developed and well-nourished. No distress.  Well appearing (appears younger than stated age)    HENT:  Head: Normocephalic and atraumatic.  Eyes: Pupils are equal, round, and reactive to light. Conjunctivae and EOM are normal.  Neck: Normal range of motion. Neck supple.  Cardiovascular: Normal rate, regular rhythm and normal heart sounds.  Pulmonary/Chest: Effort normal and breath sounds normal.  Abdominal: Soft. Bowel sounds are normal. He exhibits no distension. There is no tenderness. There is no rebound.  No cva tenderness  No suprapubic tenderness or fullness   Tympanic abdomen   Musculoskeletal: He exhibits no edema.  Lymphadenopathy:    He has no cervical adenopathy.  Neurological: He is alert.  Skin: No rash noted.  Psychiatric: He has a normal mood and affect.          Assessment & Plan:   Problem List Items Addressed This Visit      Genitourinary   UTI (urinary tract infection) - Primary    Pos ua- cx sent  Enc water intake  Rev last labs- ? Some renal insufficiency (adv him to stop aleve)  Px keflex for 7 d  Update if not starting to improve in several days  or if worsening        Relevant Medications   cephALEXin (KEFLEX) 250 MG capsule   Other Relevant Orders   Urine Culture    Other Visit Diagnoses    Dysuria       Relevant Orders   POCT Urinalysis Dipstick (Automated) (Completed)

## 2017-09-30 NOTE — Patient Instructions (Signed)
Drink lots of water  Stop aleve pm   Take keflex as directed for uti We will culture urine sample and update you when that returns  If symptoms worsen -please let me know

## 2017-09-30 NOTE — Assessment & Plan Note (Signed)
Pos ua- cx sent  Enc water intake  Rev last labs- ? Some renal insufficiency (adv him to stop aleve)  Px keflex for 7 d  Update if not starting to improve in several days  or if worsening

## 2017-10-02 LAB — URINE CULTURE
MICRO NUMBER:: 90953044
SPECIMEN QUALITY:: ADEQUATE

## 2017-10-24 ENCOUNTER — Telehealth: Payer: Self-pay | Admitting: *Deleted

## 2017-10-24 NOTE — Telephone Encounter (Signed)
-----   Message from Abner Greenspan, MD sent at 10/24/2017 10:18 AM EDT ----- Please let pt know that his uti is resistant to the keflex I gave him  I need to switch to cipro (renally dosed for kidney insufficiency)  Please send in cipro 250 mg 1 po bid #10 no refills to his preferred pharmacy Please alert [place] if symptoms worsen or do not respond  Make sure to drink water

## 2017-10-24 NOTE — Telephone Encounter (Signed)
Left VM requesting pt to call

## 2017-10-25 MED ORDER — CIPROFLOXACIN HCL 250 MG PO TABS: 250.0000 mg | ORAL_TABLET | Freq: Two times a day (BID) | ORAL | 0 refills | Status: DC

## 2017-10-25 NOTE — Telephone Encounter (Signed)
Pt notified of urine cx results and that new Abx was sent to pharmacy

## 2017-10-26 DIAGNOSIS — Z23 Encounter for immunization: Secondary | ICD-10-CM | POA: Diagnosis not present

## 2017-11-14 DIAGNOSIS — L821 Other seborrheic keratosis: Secondary | ICD-10-CM | POA: Diagnosis not present

## 2017-11-14 DIAGNOSIS — L570 Actinic keratosis: Secondary | ICD-10-CM | POA: Diagnosis not present

## 2017-12-10 ENCOUNTER — Encounter: Payer: Self-pay | Admitting: Internal Medicine

## 2017-12-10 ENCOUNTER — Ambulatory Visit (INDEPENDENT_AMBULATORY_CARE_PROVIDER_SITE_OTHER): Payer: Medicare Other | Admitting: Internal Medicine

## 2017-12-10 VITALS — BP 128/68 | HR 75 | Temp 97.7°F | Ht 63.75 in | Wt 142.0 lb

## 2017-12-10 DIAGNOSIS — N138 Other obstructive and reflux uropathy: Secondary | ICD-10-CM | POA: Insufficient documentation

## 2017-12-10 DIAGNOSIS — M0579 Rheumatoid arthritis with rheumatoid factor of multiple sites without organ or systems involvement: Secondary | ICD-10-CM

## 2017-12-10 DIAGNOSIS — Z0000 Encounter for general adult medical examination without abnormal findings: Secondary | ICD-10-CM | POA: Diagnosis not present

## 2017-12-10 DIAGNOSIS — E038 Other specified hypothyroidism: Secondary | ICD-10-CM | POA: Diagnosis not present

## 2017-12-10 DIAGNOSIS — G629 Polyneuropathy, unspecified: Secondary | ICD-10-CM | POA: Diagnosis not present

## 2017-12-10 DIAGNOSIS — N183 Chronic kidney disease, stage 3 unspecified: Secondary | ICD-10-CM

## 2017-12-10 DIAGNOSIS — Z7189 Other specified counseling: Secondary | ICD-10-CM | POA: Diagnosis not present

## 2017-12-10 DIAGNOSIS — N401 Enlarged prostate with lower urinary tract symptoms: Secondary | ICD-10-CM | POA: Diagnosis not present

## 2017-12-10 LAB — RENAL FUNCTION PANEL
ALBUMIN: 3.9 g/dL (ref 3.5–5.2)
BUN: 27 mg/dL — AB (ref 6–23)
CALCIUM: 9.6 mg/dL (ref 8.4–10.5)
CO2: 30 mEq/L (ref 19–32)
Chloride: 103 mEq/L (ref 96–112)
Creatinine, Ser: 1.51 mg/dL — ABNORMAL HIGH (ref 0.40–1.50)
GFR: 45.81 mL/min — AB (ref >60.00)
Glucose, Bld: 85 mg/dL (ref 70–99)
PHOSPHORUS: 3.7 mg/dL (ref 2.3–4.6)
Potassium: 4.2 mEq/L (ref 3.5–5.1)
SODIUM: 137 meq/L (ref 135–145)

## 2017-12-10 LAB — TSH: TSH: 6.01 u[IU]/mL — AB (ref 0.35–4.50)

## 2017-12-10 LAB — CBC
HEMATOCRIT: 42.5 % (ref 39.0–52.0)
Hemoglobin: 14.2 g/dL (ref 13.0–17.0)
MCHC: 33.5 g/dL (ref 30.0–36.0)
MCV: 93.3 fl (ref 78.0–100.0)
Platelets: 261 10*3/uL (ref 150.0–400.0)
RBC: 4.56 Mil/uL (ref 4.22–5.81)
RDW: 14.3 % (ref 11.5–15.5)
WBC: 7 10*3/uL (ref 4.0–10.5)

## 2017-12-10 LAB — HEPATIC FUNCTION PANEL
ALT: 8 U/L (ref 0–53)
AST: 11 U/L (ref 0–37)
Albumin: 3.9 g/dL (ref 3.5–5.2)
Alkaline Phosphatase: 61 U/L (ref 39–117)
BILIRUBIN TOTAL: 0.6 mg/dL (ref 0.2–1.2)
Bilirubin, Direct: 0.1 mg/dL (ref 0.0–0.3)
Total Protein: 7.5 g/dL (ref 6.0–8.3)

## 2017-12-10 LAB — T4, FREE: FREE T4: 0.87 ng/dL (ref 0.60–1.60)

## 2017-12-10 MED ORDER — TAMSULOSIN HCL 0.4 MG PO CAPS: 0.4000 mg | ORAL_CAPSULE | Freq: Every day | ORAL | 3 refills | Status: DC

## 2017-12-10 MED ORDER — LEVOTHYROXINE SODIUM 50 MCG PO TABS: ORAL_TABLET | ORAL | 3 refills | Status: DC

## 2017-12-10 NOTE — Assessment & Plan Note (Signed)
Still controlled with the enbrel

## 2017-12-10 NOTE — Assessment & Plan Note (Signed)
I have personally reviewed the Medicare Annual Wellness questionnaire and have noted 1. The patient's medical and social history 2. Their use of alcohol, tobacco or illicit drugs 3. Their current medications and supplements 4. The patient's functional ability including ADL's, fall risks, home safety risks and hearing or visual             impairment. 5. Diet and physical activities 6. Evidence for depression or mood disorders  The patients weight, height, BMI and visual acuity have been recorded in the chart I have made referrals, counseling and provided education to the patient based review of the above and I have provided the pt with a written personalized care plan for preventive services.  I have provided you with a copy of your personalized plan for preventive services. Please take the time to review along with your updated medication list.  Just had shingrix and had flu vaccine No cancer screening due to age Exercises regularly

## 2017-12-10 NOTE — Assessment & Plan Note (Signed)
Probably mostly age related Will recheck

## 2017-12-10 NOTE — Assessment & Plan Note (Signed)
Not overly painful---mostly affects balance Now using walker and not falling much

## 2017-12-10 NOTE — Assessment & Plan Note (Signed)
Seems to be euthyroid Will check labs 

## 2017-12-10 NOTE — Progress Notes (Signed)
Visual Acuity Screening   Right eye Left eye Both eyes  Without correction: 20/40 20/40 20/25   With correction:     Hearing Screening Comments: He has hearing aids. Not wearing them today.

## 2017-12-10 NOTE — Progress Notes (Signed)
Subjective:    Patient ID: Isaac Dixon, male    DOB: 1920-07-10, 84 y.o.   MRN: [id number]  HPI Here with partner for Medicare wellness visit and follow up of chronic health conditions Reviewed form and advanced directives Reviewed other doctors 1/2 glass of red wine daily No tobacco Continues to exercise regularly Vision is fine Has hearing aides---forgot today Independent with instrumental ADLs No longer driving---other than on campus No sig memory issues  Had UTI ~2 months ago Does have very slow stream and trouble emptying Is willing to try medication Nocturia x 2-3 Some urgency but often goes long periods without going  Ongoing neuropathy Using walker now Only 1 fall this year----no sig injury then  Continues on enbrel for RA This is controlling his joint pain Sees Dr Jefm Bryant  Reviewed labs Has known CKD3 from testing Will need recheck today  Current Outpatient Medications on File Prior to Visit  Medication Sig Dispense Refill  . aspirin 81 MG tablet Take 81 mg by mouth daily.    Marland Kitchen etanercept (ENBREL) 50 MG/ML injection Inject 50 mg into the skin once a week.    . gabapentin (NEURONTIN) 100 MG capsule Take 1 capsule by mouth 2 (two) times daily.    Marland Kitchen levothyroxine (SYNTHROID, LEVOTHROID) 50 MCG tablet take 1 tablet by mouth every morning ON AN EMPTY STOMACH 90 tablet 3  . Melatonin 5 MG TABS Take by mouth.     No current facility-administered medications on file prior to visit.     No Known Allergies  Past Medical History:  Diagnosis Date  . Hypothyroidism   . Osteoarthritis, knee    just left---better after TKR.  Marland Kitchen Rheumatoid arthritis [facility])    sees Dr Jefm Bryant  . TIA (transient ischemic attack) 1980's   negative work up    Past Surgical History:  Procedure Laterality Date  . INGUINAL HERNIA REPAIR  2013  . TONSILLECTOMY AND ADENOIDECTOMY  1938  . TOTAL KNEE ARTHROPLASTY Left 2006   original 1999 then replaced due to infection    Family  History  Problem Relation Age of Onset  . COPD Father   . Heart disease Brother     Social History   Socioeconomic History  . Marital status: Significant Other    Spouse name: Not on file  . Number of children: 3  . Years of education: Not on file  . Highest education level: Not on file  Occupational History  . Occupation: Retired Ecologist  . Financial resource strain: Not on file  . Food insecurity:    Worry: Not on file    Inability: Not on file  . Transportation needs:    Medical: Not on file    Non-medical: Not on file  Tobacco Use  . Smoking status: Former Research scientist (life sciences)  . Smokeless tobacco: Never Used  Substance and Sexual Activity  . Alcohol use: Yes    Alcohol/week: 0.0 standard drinks    Comment: occ  . Drug use: No  . Sexual activity: Not on file  Lifestyle  . Physical activity:    Days per week: Not on file    Minutes per session: Not on file  . Stress: Not on file  Relationships  . Social connections:    Talks on phone: Not on file    Gets together: Not on file    Attends religious service: Not on file    Active member of club or organization: Not on file    Attends  meetings of clubs or organizations: Not on file    Relationship status: Not on file  . Intimate partner violence:    Fear of current or ex partner: Not on file    Emotionally abused: Not on file    Physically abused: Not on file    Forced sexual activity: Not on file  Other Topics Concern  . Not on file  Social History Narrative   Widowed 5/07   Together with [name] since 2008.      Has living will   Health care POA is partner [name]. Son is alternate   Would accept attempts at resuscitation--but considering DNR   No feeding tube if cognitively unaware   Review of Systems Appetite is good Weight is down 3# from last year Sleeps fine with melatonin. No AM fogginess Afternoon nap ~30 minutes Wears seat belt Teeth okay---keeps up with dentist  bowels are fine--no  blood No sig back or joint pain No heartburn or dysphagia No suspicious skin lesions---keeps up with derm (blue light Rx on scalp and face)      Objective:   Physical Exam  Constitutional: He is oriented to person, place, and time. He appears well-developed. No distress.  HENT:  Mouth/Throat: Oropharynx is clear and moist. No oropharyngeal exudate.  Neck: No thyromegaly present.  Cardiovascular: Normal rate, regular rhythm and normal heart sounds. Exam reveals no gallop.  No murmur heard. Feet warm--???very faint pulses  Respiratory: Effort normal and breath sounds normal. No respiratory distress. He has no wheezes. He has no rales.  GI: Soft. There is no tenderness.  Musculoskeletal: He exhibits no edema or tenderness.  Lymphadenopathy:    He has no cervical adenopathy.  Neurological: He is alert and oriented to person, place, and time.  President--- "Dwaine Deter, Bush" [phone] [id number] Recall 3/3  Skin: No rash noted. No erythema.  Psychiatric: He has a normal mood and affect. His behavior is normal.           Assessment & Plan:

## 2017-12-10 NOTE — Assessment & Plan Note (Signed)
See social history 

## 2017-12-10 NOTE — Assessment & Plan Note (Signed)
Given the recent UTI and apparent retention--will try tamsulosin Discussed potential side effects

## 2018-02-20 DIAGNOSIS — L570 Actinic keratosis: Secondary | ICD-10-CM | POA: Diagnosis not present

## 2018-02-20 DIAGNOSIS — L219 Seborrheic dermatitis, unspecified: Secondary | ICD-10-CM | POA: Diagnosis not present

## 2018-02-26 ENCOUNTER — Encounter: Payer: Self-pay | Admitting: Family Medicine

## 2018-02-26 ENCOUNTER — Ambulatory Visit (INDEPENDENT_AMBULATORY_CARE_PROVIDER_SITE_OTHER)
Admission: RE | Admit: 2018-02-26 | Discharge: 2018-02-26 | Disposition: A | Discharge status: Home / Self Care | Payer: Medicare Other | Source: Ambulatory Visit | Attending: Family Medicine | Admitting: Family Medicine

## 2018-02-26 ENCOUNTER — Ambulatory Visit (INDEPENDENT_AMBULATORY_CARE_PROVIDER_SITE_OTHER): Payer: Medicare Other | Admitting: Family Medicine

## 2018-02-26 VITALS — BP 122/66 | HR 86 | Temp 98.4°F | Ht 63.75 in | Wt 146.8 lb

## 2018-02-26 DIAGNOSIS — R059 Cough, unspecified: Secondary | ICD-10-CM

## 2018-02-26 DIAGNOSIS — R05 Cough: Secondary | ICD-10-CM

## 2018-02-26 DIAGNOSIS — M0579 Rheumatoid arthritis with rheumatoid factor of multiple sites without organ or systems involvement: Secondary | ICD-10-CM

## 2018-02-26 NOTE — Assessment & Plan Note (Signed)
Coarse crackles bi-basilarly ?fibrosis.  Check CXR r/o infiltrate or acute process. Overall well appearing, anticipate viral process. Supportive care reviewed per instructions.  Red flags to seek further care extensively reviewed.

## 2018-02-26 NOTE — Progress Notes (Signed)
BP 122/66 (BP Location: Left Arm, Patient Position: Sitting, Cuff Size: Normal)   Pulse 86   Temp 98.4 F (36.9 C) (Oral)   Ht 5' 3.75" (1.619 m)   Wt 146 lb 12 oz (66.6 kg)   SpO2 100%   BMI 25.39 kg/m    CC: cough Subjective:    Patient ID: Isaac Dixon, male    DOB: [birth year], 85 y.o.   MRN: [id number]  HPI: Acel Natzke is a 85 y.o. male presenting on 02/26/2018 for Cough (C/o productive cough- worse at night. Started 2 days ago.  Pt accompanied by his wife, [name]. )   2d h/o productive cough, head congestion without chest congestion. Worse symptoms in am and at night, does better during the day.   No fevers/chills, ear or tooth pain, ST, PNdrainage, dyspnea or wheezing. No body aches.   Tried old cough syrup at home this morning which seems to have helped.   No sick contacts at home.  No h/o asthma. Ex smoker.   Continues doing 40 push ups per day.  H/o RA on Enbrel for the past 20 yrs.     Relevant past medical, surgical, family and social history reviewed and updated as indicated. Interim medical history since our last visit reviewed. Allergies and medications reviewed and updated. Outpatient Medications Prior to Visit  Medication Sig Dispense Refill  . aspirin 81 MG tablet Take 81 mg by mouth daily.    Marland Kitchen etanercept (ENBREL) 50 MG/ML injection Inject 50 mg into the skin once a week.    . gabapentin (NEURONTIN) 100 MG capsule Take 1 capsule by mouth 2 (two) times daily.    Marland Kitchen levothyroxine (SYNTHROID, LEVOTHROID) 50 MCG tablet take 1 tablet by mouth every morning ON AN EMPTY STOMACH 90 tablet 3  . Melatonin 5 MG TABS Take by mouth.    . tamsulosin (FLOMAX) 0.4 MG CAPS capsule Take 1 capsule (0.4 mg total) by mouth daily. (Patient not taking: Reported on 02/26/2018) 90 capsule 3   No facility-administered medications prior to visit.      Per HPI unless specifically indicated in ROS section below Review of Systems Objective:    BP 122/66 (BP Location: Left Arm,  Patient Position: Sitting, Cuff Size: Normal)   Pulse 86   Temp 98.4 F (36.9 C) (Oral)   Ht 5' 3.75" (1.619 m)   Wt 146 lb 12 oz (66.6 kg)   SpO2 100%   BMI 25.39 kg/m   Wt Readings from Last 3 Encounters:  02/26/18 146 lb 12 oz (66.6 kg)  12/10/17 142 lb (64.4 kg)  09/30/17 140 lb 4 oz (63.6 kg)    Physical Exam Vitals signs and nursing note reviewed.  Constitutional:      General: He is not in acute distress.    Appearance: Normal appearance. He is well-developed. He is not ill-appearing.     Comments: Well appearing  HENT:     Head: Normocephalic and atraumatic.     Right Ear: Hearing, ear canal and external ear normal.     Left Ear: Hearing, ear canal and external ear normal.     Ears:     Comments: Cerumen in canals bilaterally    Nose: Mucosal edema (mild) present. No rhinorrhea.     Right Sinus: No maxillary sinus tenderness or frontal sinus tenderness.     Left Sinus: No maxillary sinus tenderness or frontal sinus tenderness.     Mouth/Throat:     Pharynx: Uvula midline. No oropharyngeal exudate  or posterior oropharyngeal erythema.     Tonsils: No tonsillar abscesses.  Eyes:     General: No scleral icterus.    Conjunctiva/sclera: Conjunctivae normal.     Pupils: Pupils are equal, round, and reactive to light.  Neck:     Musculoskeletal: Normal range of motion and neck supple.  Cardiovascular:     Rate and Rhythm: Normal rate and regular rhythm.     Heart sounds: Normal heart sounds. No murmur.  Pulmonary:     Effort: Pulmonary effort is normal. No respiratory distress.     Breath sounds: Normal breath sounds. No wheezing, rhonchi or rales.     Comments: Coarse crackles bibasilarly improve with deep cough but persist O2 sat 100% Lymphadenopathy:     Cervical: No cervical adenopathy.  Skin:    General: Skin is warm and dry.     Findings: No rash.  Neurological:     Mental Status: He is alert.        Assessment & Plan:   Problem List Items Addressed  This Visit    Rheumatoid arthritis ([place])    On enbrel      Cough - Primary    Coarse crackles bi-basilarly ?fibrosis.  Check CXR r/o infiltrate or acute process. Overall well appearing, anticipate viral process. Supportive care reviewed per instructions.  Red flags to seek further care extensively reviewed.       Relevant Orders   DG Chest 2 View       No orders of the defined types were placed in this encounter.  Orders Placed This Encounter  Procedures  . DG Chest 2 View    Standing Status:   Future    Standing Expiration Date:   04/27/2019    Order Specific Question:   Reason for Exam (SYMPTOM  OR DIAGNOSIS REQUIRED)    Answer:   cough with bibasilar crackles    Order Specific Question:   Preferred imaging location?    Answer:   [facility]    Order Specific Question:   Radiology Contrast Protocol - do NOT remove file path    Answer:   [email or web address]   Patient Instructions  I think you have viral respiratory infection - this should improve on its own. These infections usually take 7-10 days to resolve, the cough can linger a bit longer.  Let's check xray today.  Drink fluids and get rest.  Take plain mucinex (guaifenesin) with large glass of water to help mobilize mucous.  May try over the counter delsym or robitussin for night time cough.  Let us know if fever >101, worsening productive cough or worsening malaise.    Follow up plan: No follow-ups on file.  Ria Bush, MD

## 2018-02-26 NOTE — Assessment & Plan Note (Signed)
On enbrel

## 2018-02-26 NOTE — Patient Instructions (Addendum)
I think you have viral respiratory infection - this should improve on its own. These infections usually take 7-10 days to resolve, the cough can linger a bit longer.  Let's check xray today.  Drink fluids and get rest.  Take plain mucinex (guaifenesin) with large glass of water to help mobilize mucous.  May try over the counter delsym or robitussin for night time cough.  Let us know if fever >101, worsening productive cough or worsening malaise.

## 2018-02-27 ENCOUNTER — Telehealth: Payer: Self-pay | Admitting: *Deleted

## 2018-02-27 MED ORDER — AZITHROMYCIN 250 MG PO TABS: ORAL_TABLET | ORAL | 0 refills | Status: DC

## 2018-02-27 NOTE — Telephone Encounter (Signed)
Spoke to [place], pts POA who states they were to contact office back, per Dr Darnell Level, if pt's temperature increased. She states she took his temp and it was 101.71F and states that it was "right at 100 but creeping up." Robyn requested a call back on how to proceed. pls advise

## 2018-02-27 NOTE — Telephone Encounter (Signed)
If truly 101 then let's go ahead and start antibiotic sent to pharmacy. As long as he's feeling ok that is all we need to do. Yesterday's xray looked ok

## 2018-02-27 NOTE — Telephone Encounter (Signed)
Spoke to [place] and advised. She states that the pt is now "falling asleep everytime he sits down." She thinks that he may be fatigued and will pickup Rx to start. Pt is not wanting to return to the office at this time to be re-evaluated. Bailey Mech states she will start abx and contact us back if he has not improved.

## 2018-04-17 ENCOUNTER — Telehealth: Payer: Self-pay | Admitting: Internal Medicine

## 2018-04-17 NOTE — Telephone Encounter (Signed)
Patient was taking 2 pills instead of 1 of his Synthroid.  Patient said the pharmacy won't refill his medication.  Patient uses [facility] [address].

## 2018-04-17 NOTE — Telephone Encounter (Signed)
Spoke with Audelia Acton at [facility], asking about pt's rx.  States insurance will not cover because it's too early to refill.    Spoke with pt explaining the issue and reminded him of correct directions. He verbalizes understanding. Says he's willing to pay out of pocket and will call [facility] to let them know.

## 2018-08-18 ENCOUNTER — Telehealth: Payer: Self-pay

## 2018-08-18 ENCOUNTER — Other Ambulatory Visit: Payer: Self-pay

## 2018-08-18 ENCOUNTER — Ambulatory Visit (INDEPENDENT_AMBULATORY_CARE_PROVIDER_SITE_OTHER): Payer: Medicare Other | Admitting: Family Medicine

## 2018-08-18 VITALS — BP 110/68 | HR 63 | Temp 97.6°F | Ht 63.75 in | Wt 146.5 lb

## 2018-08-18 DIAGNOSIS — L03116 Cellulitis of left lower limb: Secondary | ICD-10-CM | POA: Diagnosis not present

## 2018-08-18 DIAGNOSIS — S8012XA Contusion of left lower leg, initial encounter: Secondary | ICD-10-CM | POA: Diagnosis not present

## 2018-08-18 MED ORDER — SULFAMETHOXAZOLE-TRIMETHOPRIM 800-160 MG PO TABS: 2.0000 | ORAL_TABLET | Freq: Two times a day (BID) | ORAL | 0 refills | Status: DC

## 2018-08-18 MED ORDER — SULFAMETHOXAZOLE-TRIMETHOPRIM 800-160 MG PO TABS: 1.0000 | ORAL_TABLET | Freq: Two times a day (BID) | ORAL | 0 refills | Status: AC

## 2018-08-18 NOTE — Telephone Encounter (Signed)
[place] pharmacist at [facility] / [address] left v/m requesting cb to verify which Bactrim rx is needed. 2 different rx sent with different instructions and qty.

## 2018-08-18 NOTE — Telephone Encounter (Signed)
I had left a voicemail earlier today with [facility] to fill the Bactrim DS to take 1 tablet twice a day.  #20.  I called the pharmacy and they apparently got the voicemail and states patient has picked up his prescription.

## 2018-08-18 NOTE — Progress Notes (Signed)
Keighley Deckman T. Terry Bolotin, MD Primary Care and [place] at [facility] [place], [address] Phone: [phone]  FAX: [phone]  Isaac Dixon - 85 y.o. male  MRN [id number]  Date of Birth: [birth year]  Visit Date: 08/18/2018  PCP: Venia Carbon, MD  Referred by: Venia Carbon, MD  Chief Complaint  Patient presents with  . Knots on Left Leg   Subjective:   Isaac Dixon is a 85 y.o. very pleasant male patient who presents with the following:  L knots on lateral LE.  Showed up in the last 24 hours.  He does not have any knowledge of the specific trauma.  He is on aspirin, but no other blood thinners.  Generally he is a healthy 85 year old man and functional.  The area is warm, and mildly pinkish in appearance.  This is on the lateral aspect of the left lower extremity.  Is not having any systemic symptoms, systemic fever, or other URI, pulmonary symptoms, or GI.  Warm reddish Hematoma or infected hematoma ASA  pictures  Past Medical History, Surgical History, Social History, Family History, Problem List, Medications, and Allergies have been reviewed and updated if relevant.  Patient Active Problem List   Diagnosis Date Noted  . BPH with obstruction/lower urinary tract symptoms 12/10/2017  . Chronic kidney disease, stage III (moderate) ([place]) 12/10/2017  . UTI (urinary tract infection) 09/30/2017  . Cough 06/20/2017  . Preventative health care 07/06/2014  . Peripheral neuropathy 07/06/2014  . Advance directive discussed with patient 07/06/2014  . TIA (transient ischemic attack)   . Rheumatoid arthritis ([place])   . Hypothyroidism     Past Medical History:  Diagnosis Date  . Hypothyroidism   . Osteoarthritis, knee    just left---better after TKR.  Marland Kitchen Rheumatoid arthritis [facility])    sees Dr Jefm Bryant  . TIA (transient ischemic attack) 1980's   negative work up    Past Surgical History:  Procedure  Laterality Date  . INGUINAL HERNIA REPAIR  2013  . TONSILLECTOMY AND ADENOIDECTOMY  1938  . TOTAL KNEE ARTHROPLASTY Left 2006   original 1999 then replaced due to infection    Social History   Socioeconomic History  . Marital status: Significant Other    Spouse name: Not on file  . Number of children: 3  . Years of education: Not on file  . Highest education level: Not on file  Occupational History  . Occupation: Retired Ecologist  . Financial resource strain: Not on file  . Food insecurity    Worry: Not on file    Inability: Not on file  . Transportation needs    Medical: Not on file    Non-medical: Not on file  Tobacco Use  . Smoking status: Former Research scientist (life sciences)  . Smokeless tobacco: Never Used  Substance and Sexual Activity  . Alcohol use: Yes    Alcohol/week: 0.0 standard drinks    Comment: occ  . Drug use: No  . Sexual activity: Not on file  Lifestyle  . Physical activity    Days per week: Not on file    Minutes per session: Not on file  . Stress: Not on file  Relationships  . Social Herbalist on phone: Not on file    Gets together: Not on file    Attends religious service: Not on file    Active member of club or organization: Not on file  Attends meetings of clubs or organizations: Not on file    Relationship status: Not on file  . Intimate partner violence    Fear of current or ex partner: Not on file    Emotionally abused: Not on file    Physically abused: Not on file    Forced sexual activity: Not on file  Other Topics Concern  . Not on file  Social History Narrative   Widowed 5/07   Together with [name] since 2008.      Has living will   Health care POA is partner [name]. Son is alternate   Would accept attempts at resuscitation--but considering DNR   No feeding tube if cognitively unaware    Family History  Problem Relation Age of Onset  . COPD Father   . Heart disease Brother     No Known Allergies  Medication  list reviewed and updated in full in [place].   GEN: No acute illnesses, no fevers, chills. GI: No n/v/d, eating normally Pulm: No SOB Interactive and getting along well at home.  Otherwise, ROS is as per the HPI.  Objective:   BP 110/68   Pulse 63   Temp 97.6 F (36.4 C) (Temporal)   Ht 5' 3.75" (1.619 m)   Wt 146 lb 8 oz (66.5 kg)   BMI 25.34 kg/m   GEN: WDWN, NAD, Non-toxic, A & O x 3 HEENT: Atraumatic, Normocephalic. Neck supple. No masses, No LAD. Ears and Nose: No external deformity. EXTR: No c/c/1+ LE edema NEURO Normal gait.  PSYCH: Normally interactive. Conversant. Not depressed or anxious appearing.  Calm demeanor.       Area as above.  No fluctuance, not indurated, but mildly warm to touch and mildly red compared to the contralateral side.   Laboratory and Imaging Data: Results for orders placed or performed in visit on 12/10/17  Renal function panel  Result Value Ref Range   Sodium 137 135 - 145 mEq/L   Potassium 4.2 3.5 - 5.1 mEq/L   Chloride 103 96 - 112 mEq/L   CO2 30 19 - 32 mEq/L   Calcium 9.6 8.4 - 10.5 mg/dL   Albumin 3.9 3.5 - 5.2 g/dL   BUN 27 (H) 6 - 23 mg/dL   Creatinine, Ser 1.51 (H) 0.40 - 1.50 mg/dL   Glucose, Bld 85 70 - 99 mg/dL   Phosphorus 3.7 2.3 - 4.6 mg/dL   GFR 45.81 (L) >60.00 mL/min  CBC  Result Value Ref Range   WBC 7.0 4.0 - 10.5 K/uL   RBC 4.56 4.22 - 5.81 Mil/uL   Platelets 261.0 150.0 - 400.0 K/uL   Hemoglobin 14.2 13.0 - 17.0 g/dL   HCT 42.5 39.0 - 52.0 %   MCV 93.3 78.0 - 100.0 fl   MCHC 33.5 30.0 - 36.0 g/dL   RDW 14.3 11.5 - 15.5 %  Hepatic function panel  Result Value Ref Range   Total Bilirubin 0.6 0.2 - 1.2 mg/dL   Bilirubin, Direct 0.1 0.0 - 0.3 mg/dL   Alkaline Phosphatase 61 39 - 117 U/L   AST 11 0 - 37 U/L   ALT 8 0 - 53 U/L   Total Protein 7.5 6.0 - 8.3 g/dL   Albumin 3.9 3.5 - 5.2 g/dL  TSH  Result Value Ref Range   TSH 6.01 (H) 0.35 - 4.50 uIU/mL  T4, free  Result Value Ref Range    Free T4 0.87 0.60 - 1.60 ng/dL  Assessment and Plan:     ICD-10-CM   1. Cellulitis of left leg  L03.116   2. Hematoma of leg, left, initial encounter  S80.12XA    Has the appearance of hematoma rather than abscess, but warmth and redness suggest infected.   Hematoma can certainly be infected as well. Will start ABX with close f/u with PCP  Follow-up: No follow-ups on file.  Meds ordered this encounter  Medications  . DISCONTD: sulfamethoxazole-trimethoprim (BACTRIM DS) 800-160 MG tablet    Sig: Take 2 tablets by mouth 2 (two) times daily for 10 days.    Dispense:  40 tablet    Refill:  0  . sulfamethoxazole-trimethoprim (BACTRIM DS) 800-160 MG tablet    Sig: Take 1 tablet by mouth 2 (two) times daily for 10 days.    Dispense:  20 tablet    Refill:  0   No orders of the defined types were placed in this encounter.   Signed,  Maud Deed. Marwin Primmer, MD   Outpatient Encounter Medications as of 08/18/2018  Medication Sig  . aspirin 81 MG tablet Take 81 mg by mouth daily.  Marland Kitchen etanercept (ENBREL) 50 MG/ML injection Inject 50 mg into the skin once a week.  . gabapentin (NEURONTIN) 100 MG capsule Take 1 capsule by mouth 2 (two) times daily.  Marland Kitchen levothyroxine (SYNTHROID, LEVOTHROID) 50 MCG tablet take 1 tablet by mouth every morning ON AN EMPTY STOMACH  . Melatonin 5 MG TABS Take by mouth.  . tamsulosin (FLOMAX) 0.4 MG CAPS capsule Take 1 capsule (0.4 mg total) by mouth daily.  Marland Kitchen sulfamethoxazole-trimethoprim (BACTRIM DS) 800-160 MG tablet Take 1 tablet by mouth 2 (two) times daily for 10 days.  . [DISCONTINUED] azithromycin (ZITHROMAX) 250 MG tablet Take two tablets on day one followed by one tablet on days 2-5  . [DISCONTINUED] sulfamethoxazole-trimethoprim (BACTRIM DS) 800-160 MG tablet Take 2 tablets by mouth 2 (two) times daily for 10 days.   No facility-administered encounter medications on file as of 08/18/2018.

## 2018-08-19 ENCOUNTER — Encounter: Payer: Self-pay | Admitting: Family Medicine

## 2018-08-20 ENCOUNTER — Ambulatory Visit (INDEPENDENT_AMBULATORY_CARE_PROVIDER_SITE_OTHER): Payer: Medicare Other | Admitting: Internal Medicine

## 2018-08-20 ENCOUNTER — Encounter: Payer: Self-pay | Admitting: Internal Medicine

## 2018-08-20 ENCOUNTER — Other Ambulatory Visit: Payer: Self-pay

## 2018-08-20 DIAGNOSIS — L03116 Cellulitis of left lower limb: Secondary | ICD-10-CM | POA: Diagnosis not present

## 2018-08-20 NOTE — Patient Instructions (Signed)
Please take 6 more of the antibiotics and then stop. If those raised areas don't go down in the next 3-4 weeks---I should check it again.

## 2018-08-20 NOTE — Assessment & Plan Note (Signed)
Clearly not as red or inflamed Still not clear what the raised areas are---but don't feel like they are inflamed veins---most likely small hematomas (though no remembered trauma) Will continue the antibiotic for 3 more days Would plan to recheck if the raised areas don't resolve over the next few weeks

## 2018-08-20 NOTE — Progress Notes (Signed)
Subjective:    Patient ID: Isaac Dixon, male    DOB: [birth year], 85 y.o.   MRN: [id number]  HPI Here with wife for follow up of left calf cellulitis  They don't see much difference in the appearance No problems doing his time on the exercise bike Still swelling No pain--but is tender some  Current Outpatient Medications on File Prior to Visit  Medication Sig Dispense Refill  . aspirin 81 MG tablet Take 81 mg by mouth daily.    Marland Kitchen etanercept (ENBREL) 50 MG/ML injection Inject 50 mg into the skin once a week.    . gabapentin (NEURONTIN) 100 MG capsule Take 1 capsule by mouth 2 (two) times daily.    Marland Kitchen levothyroxine (SYNTHROID, LEVOTHROID) 50 MCG tablet take 1 tablet by mouth every morning ON AN EMPTY STOMACH 90 tablet 3  . Melatonin 5 MG TABS Take by mouth.    . sulfamethoxazole-trimethoprim (BACTRIM DS) 800-160 MG tablet Take 1 tablet by mouth 2 (two) times daily for 10 days. 20 tablet 0  . tamsulosin (FLOMAX) 0.4 MG CAPS capsule Take 1 capsule (0.4 mg total) by mouth daily. 90 capsule 3   No current facility-administered medications on file prior to visit.     No Known Allergies  Past Medical History:  Diagnosis Date  . Hypothyroidism   . Osteoarthritis, knee    just left---better after TKR.  Marland Kitchen Rheumatoid arthritis [facility])    sees Dr Jefm Bryant  . TIA (transient ischemic attack) 1980's   negative work up    Past Surgical History:  Procedure Laterality Date  . INGUINAL HERNIA REPAIR  2013  . TONSILLECTOMY AND ADENOIDECTOMY  1938  . TOTAL KNEE ARTHROPLASTY Left 2006   original 1999 then replaced due to infection    Family History  Problem Relation Age of Onset  . COPD Father   . Heart disease Brother     Social History   Socioeconomic History  . Marital status: Significant Other    Spouse name: Not on file  . Number of children: 3  . Years of education: Not on file  . Highest education level: Not on file  Occupational History  . Occupation: Retired IT consultant  . Financial resource strain: Not on file  . Food insecurity    Worry: Not on file    Inability: Not on file  . Transportation needs    Medical: Not on file    Non-medical: Not on file  Tobacco Use  . Smoking status: Former Research scientist (life sciences)  . Smokeless tobacco: Never Used  Substance and Sexual Activity  . Alcohol use: Yes    Alcohol/week: 0.0 standard drinks    Comment: occ  . Drug use: No  . Sexual activity: Not on file  Lifestyle  . Physical activity    Days per week: Not on file    Minutes per session: Not on file  . Stress: Not on file  Relationships  . Social Herbalist on phone: Not on file    Gets together: Not on file    Attends religious service: Not on file    Active member of club or organization: Not on file    Attends meetings of clubs or organizations: Not on file    Relationship status: Not on file  . Intimate partner violence    Fear of current or ex partner: Not on file    Emotionally abused: Not on file    Physically abused: Not on file  Forced sexual activity: Not on file  Other Topics Concern  . Not on file  Social History Narrative   Widowed 5/07   Together with [name] since 2008.      Has living will   Health care POA is partner [name]. Son is alternate   Would accept attempts at resuscitation--but considering DNR   No feeding tube if cognitively unaware   Review of Systems No fever Doesn't feel sick No N/V Doing Rx with Dr Birdie Sons for neuropathy--wonders about continuing (7 biweekly treatments so far). Wife is concerned about their sanitary practices    Objective:   Physical Exam  Constitutional: He appears well-developed. No distress.  Musculoskeletal:     Comments: 2 raised areas on mid lateral left calf No redness in circled areas now Slight pale erythema at the top of the lower raised area--but no heat or tenderness           Assessment & Plan:

## 2018-08-23 ENCOUNTER — Other Ambulatory Visit: Payer: Self-pay | Admitting: Internal Medicine

## 2018-10-23 DIAGNOSIS — Z23 Encounter for immunization: Secondary | ICD-10-CM | POA: Diagnosis not present

## 2018-11-21 ENCOUNTER — Other Ambulatory Visit: Payer: Self-pay | Admitting: Internal Medicine

## 2018-12-15 ENCOUNTER — Ambulatory Visit (INDEPENDENT_AMBULATORY_CARE_PROVIDER_SITE_OTHER): Payer: Medicare Other | Admitting: Podiatry

## 2018-12-15 ENCOUNTER — Encounter: Payer: Self-pay | Admitting: Podiatry

## 2018-12-15 ENCOUNTER — Other Ambulatory Visit: Payer: Self-pay

## 2018-12-15 DIAGNOSIS — B351 Tinea unguium: Secondary | ICD-10-CM

## 2018-12-15 DIAGNOSIS — G629 Polyneuropathy, unspecified: Secondary | ICD-10-CM

## 2018-12-15 DIAGNOSIS — M79675 Pain in left toe(s): Secondary | ICD-10-CM

## 2018-12-15 DIAGNOSIS — M79674 Pain in right toe(s): Secondary | ICD-10-CM

## 2018-12-15 NOTE — Progress Notes (Signed)
Complaint:  Visit Type: Patient presents  to my office for  preventative foot care services. He was last seen in this office over five years ago.  He presents to the office with his wife. Complaint: Patient states" my nails have grown long and thick and become painful to walk and wear shoes" Patient has been diagnosed with DM with neuropathy. The patient presents for preventative foot care services.  Podiatric Exam: Vascular: dorsalis pedis and posterior tibial pulses are not  palpable bilateral. Capillary return is diminished.. Cold feet noted.. Skin turgor WNL  Sensorium: Normal Semmes Weinstein monofilament test. Normal tactile sensation bilaterally. Nail Exam: Pt has thick disfigured discolored nails with subungual debris noted bilateral entire nail hallux through fifth toenails Ulcer Exam: There is no evidence of ulcer or pre-ulcerative changes or infection. Orthopedic Exam: Muscle tone and strength are WNL. No limitations in general ROM. No crepitus or effusions noted. Foot type and digits show no abnormalities. Bony prominences are unremarkable. Skin: No Porokeratosis. No infection or ulcers  Diagnosis:  Onychomycosis, , Pain in right toe, pain in left toes  Treatment & Plan Procedures and Treatment: Consent by patient was obtained for treatment procedures.   Debridement of mycotic and hypertrophic toenails, 1 through 5 bilateral and clearing of subungual debris. No ulceration, no infection noted.  Return Visit-Office Procedure: Patient instructed to return to the office for a follow up visit prn  for continued evaluation and treatment.    Gardiner Barefoot DPM

## 2018-12-16 ENCOUNTER — Ambulatory Visit (INDEPENDENT_AMBULATORY_CARE_PROVIDER_SITE_OTHER): Payer: Medicare Other | Admitting: Internal Medicine

## 2018-12-16 ENCOUNTER — Encounter: Payer: Self-pay | Admitting: Internal Medicine

## 2018-12-16 VITALS — BP 138/76 | HR 59 | Ht 64.0 in | Wt 148.0 lb

## 2018-12-16 DIAGNOSIS — Z7189 Other specified counseling: Secondary | ICD-10-CM

## 2018-12-16 DIAGNOSIS — Z0000 Encounter for general adult medical examination without abnormal findings: Secondary | ICD-10-CM | POA: Diagnosis not present

## 2018-12-16 DIAGNOSIS — E039 Hypothyroidism, unspecified: Secondary | ICD-10-CM | POA: Diagnosis not present

## 2018-12-16 DIAGNOSIS — M05741 Rheumatoid arthritis with rheumatoid factor of right hand without organ or systems involvement: Secondary | ICD-10-CM

## 2018-12-16 DIAGNOSIS — N401 Enlarged prostate with lower urinary tract symptoms: Secondary | ICD-10-CM | POA: Diagnosis not present

## 2018-12-16 DIAGNOSIS — G629 Polyneuropathy, unspecified: Secondary | ICD-10-CM

## 2018-12-16 DIAGNOSIS — M05742 Rheumatoid arthritis with rheumatoid factor of left hand without organ or systems involvement: Secondary | ICD-10-CM

## 2018-12-16 DIAGNOSIS — N183 Chronic kidney disease, stage 3 unspecified: Secondary | ICD-10-CM | POA: Diagnosis not present

## 2018-12-16 DIAGNOSIS — N138 Other obstructive and reflux uropathy: Secondary | ICD-10-CM | POA: Diagnosis not present

## 2018-12-16 LAB — COMPREHENSIVE METABOLIC PANEL
ALT: 12 U/L (ref 0–53)
AST: 18 U/L (ref 0–37)
Albumin: 4 g/dL (ref 3.5–5.2)
Alkaline Phosphatase: 68 U/L (ref 39–117)
BUN: 27 mg/dL — ABNORMAL HIGH (ref 6–23)
CO2: 27 mEq/L (ref 19–32)
Calcium: 9.6 mg/dL (ref 8.4–10.5)
Chloride: 103 mEq/L (ref 96–112)
Creatinine, Ser: 1.51 mg/dL — ABNORMAL HIGH (ref 0.40–1.50)
GFR: 43.01 mL/min — ABNORMAL LOW (ref >60.00)
Glucose, Bld: 125 mg/dL — ABNORMAL HIGH (ref 70–99)
Potassium: 4.8 mEq/L (ref 3.5–5.1)
Sodium: 137 mEq/L (ref 135–145)
Total Bilirubin: 0.5 mg/dL (ref 0.2–1.2)
Total Protein: 7.5 g/dL (ref 6.0–8.3)

## 2018-12-16 LAB — CBC
HCT: 43.1 % (ref 39.0–52.0)
Hemoglobin: 14.3 g/dL (ref 13.0–17.0)
MCHC: 33.2 g/dL (ref 30.0–36.0)
MCV: 94.4 fl (ref 78.0–100.0)
Platelets: 263 10*3/uL (ref 150.0–400.0)
RBC: 4.56 Mil/uL (ref 4.22–5.81)
RDW: 14.2 % (ref 11.5–15.5)
WBC: 7.5 10*3/uL (ref 4.0–10.5)

## 2018-12-16 NOTE — Assessment & Plan Note (Signed)
Controlled with enbrel Will check labs

## 2018-12-16 NOTE — Assessment & Plan Note (Signed)
Mostly due to age I believe No action needed  BP Readings from Last 3 Encounters:  12/16/18 138/76  08/20/18 122/70  08/18/18 110/68

## 2018-12-16 NOTE — Assessment & Plan Note (Signed)
Discussed trying to wean gabapentin

## 2018-12-16 NOTE — Assessment & Plan Note (Signed)
Does okay with tamsulosin

## 2018-12-16 NOTE — Assessment & Plan Note (Signed)
See social history 

## 2018-12-16 NOTE — Assessment & Plan Note (Signed)
I have personally reviewed the Medicare Annual Wellness questionnaire and have noted 1. The patient's medical and social history 2. Their use of alcohol, tobacco or illicit drugs 3. Their current medications and supplements 4. The patient's functional ability including ADL's, fall risks, home safety risks and hearing or visual             impairment. 5. Diet and physical activities 6. Evidence for depression or mood disorders  The patients weight, height, BMI and visual acuity have been recorded in the chart I have made referrals, counseling and provided education to the patient based review of the above and I have provided the pt with a written personalized care plan for preventive services.  I have provided you with a copy of your personalized plan for preventive services. Please take the time to review along with your updated medication list.  Had flu vaccine Does regular exercise No cancer screening due to age

## 2018-12-16 NOTE — Progress Notes (Signed)
Subjective:    Patient ID: Isaac Dixon, male    DOB: January 22, 1921, 85 y.o.   MRN: [id number]  HPI Here with wife for Medicare wellness visit and follow up of chronic health conditions Reviewed form and advanced directives Reviewed advanced directives Still enjoys 1/2 glass of red wine No tobacco Does sitting exercises daily (with direction from exercise director Isaac Dixon) Vision is okay [place] once --no injury No depression or anhedonia Staying in due to COVID Some mild memory issues per wife  He stays cold--especially compared to his wife No real change Continues on the thyroid medication  Continues on the enbrel--really effective with the RA Hasn't seen Isaac Dixon for some time (due to COVID)  Voids okay Stream is reasonable Nocturia x 2-3 and stable  Had cane and walker---now gets around with motorized chair in house Does ADLs himself ---with shower chair Wife does instrumental ADLs  Continues on the gabapentin for neuropathy No recent attempt to wean  GFR ~45  Current Outpatient Medications on File Prior to Visit  Medication Sig Dispense Refill  . aspirin 81 MG tablet Take 81 mg by mouth daily.    Marland Kitchen etanercept (ENBREL) 50 MG/ML injection Inject 50 mg into the skin once a week.    . gabapentin (NEURONTIN) 100 MG capsule Take 1 capsule by mouth 2 (two) times daily.    Marland Kitchen levothyroxine (SYNTHROID) 50 MCG tablet TAKE 1 TABLET BY MOUTH EVERY MORNING ON AN EMPTY STOMACH 90 tablet 3  . Melatonin 5 MG TABS Take by mouth.    . tamsulosin (FLOMAX) 0.4 MG CAPS capsule TAKE 1 CAPSULE(0.4 MG) BY MOUTH DAILY 90 capsule 3   No current facility-administered medications on file prior to visit.     No Known Allergies  Past Medical History:  Diagnosis Date  . Hypothyroidism   . Osteoarthritis, knee    just left---better after TKR.  Marland Kitchen Rheumatoid arthritis [facility])    sees Isaac Dixon  . TIA (transient ischemic attack) 1980's   negative work up    Past Surgical History:   Procedure Laterality Date  . INGUINAL HERNIA REPAIR  2013  . TONSILLECTOMY AND ADENOIDECTOMY  1938  . TOTAL KNEE ARTHROPLASTY Left 2006   original 1999 then replaced due to infection    Family History  Problem Relation Age of Onset  . COPD Father   . Heart disease Brother     Social History   Socioeconomic History  . Marital status: Significant Other    Spouse name: Not on file  . Number of children: 3  . Years of education: Not on file  . Highest education level: Not on file  Occupational History  . Occupation: Retired Ecologist  . Financial resource strain: Not on file  . Food insecurity    Worry: Not on file    Inability: Not on file  . Transportation needs    Medical: Not on file    Non-medical: Not on file  Tobacco Use  . Smoking status: Former Research scientist (life sciences)  . Smokeless tobacco: Never Used  Substance and Sexual Activity  . Alcohol use: Yes    Alcohol/week: 0.0 standard drinks    Comment: occ  . Drug use: No  . Sexual activity: Not on file  Lifestyle  . Physical activity    Days per week: Not on file    Minutes per session: Not on file  . Stress: Not on file  Relationships  . Social Herbalist on phone:  Not on file    Gets together: Not on file    Attends religious service: Not on file    Active member of club or organization: Not on file    Attends meetings of clubs or organizations: Not on file    Relationship status: Not on file  . Intimate partner violence    Fear of current or ex partner: Not on file    Emotionally abused: Not on file    Physically abused: Not on file    Forced sexual activity: Not on file  Other Topics Concern  . Not on file  Social History Narrative   Widowed 5/07   Together with Isaac Dixon since 2008.      Has living will   Health care POA is partner Isaac Dixon. Son is alternate   Would accept attempts at resuscitation--but considering DNR   No feeding tube if cognitively unaware   Review of Systems  Appetite is fine--not large Weight is fairly stable Sleeps well with melatonin. Naps daily as well Wears seat belt Teeth are fine--new dentist No current suspicious skin lesions No heartburn or dysphagia Bowels are fine---no blood No chest pain or SOB No palpitations or syncope No edema    Objective:   Physical Exam  Constitutional: He is oriented to person, place, and time. He appears well-developed. No distress.  HENT:  Mouth/Throat: Oropharynx is clear and moist.  No oral lesions  Neck: No thyromegaly present.  Cardiovascular: Normal rate, regular rhythm and normal heart sounds. Exam reveals no gallop.  No murmur heard. Warm but no palpable pulses in feet  Respiratory: Effort normal and breath sounds normal. No respiratory distress. He has no wheezes. He has no rales.  GI: Soft. There is no abdominal tenderness.  Musculoskeletal:        General: No tenderness or edema.     Comments: No active synovitis in hands  Lymphadenopathy:    He has no cervical adenopathy.  Neurological: He is alert and oriented to person, place, and time.  President--- "Isaac Dixon, Isaac Dixon" [phone] [id number] Recall 3/3  Skin: No rash noted. No erythema.  Purplish discoloration in feet--no ulcers  Psychiatric: He has a normal mood and affect. His behavior is normal.           Assessment & Plan:

## 2018-12-16 NOTE — Assessment & Plan Note (Signed)
Stays cold--but seems euthyroid Will check labs

## 2018-12-16 NOTE — Progress Notes (Signed)
Hearing Screening   125Hz  250Hz  500Hz  1000Hz  2000Hz  3000Hz  4000Hz  6000Hz  8000Hz   Right ear:           Left ear:           Comments: Has hearing aids. Wearing them today.

## 2018-12-18 LAB — T4, FREE: Free T4: 0.7 ng/dL (ref 0.60–1.60)

## 2018-12-18 LAB — TSH: TSH: 15.32 u[IU]/mL — ABNORMAL HIGH (ref 0.35–4.50)

## 2018-12-30 DIAGNOSIS — M0579 Rheumatoid arthritis with rheumatoid factor of multiple sites without organ or systems involvement: Secondary | ICD-10-CM | POA: Diagnosis not present

## 2019-01-14 ENCOUNTER — Other Ambulatory Visit: Payer: Self-pay

## 2019-02-10 ENCOUNTER — Other Ambulatory Visit: Payer: Self-pay

## 2019-02-10 ENCOUNTER — Encounter: Payer: Self-pay | Admitting: Emergency Medicine

## 2019-02-10 ENCOUNTER — Emergency Department
Admission: EM | Admit: 2019-02-10 | Discharge: 2019-02-10 | Disposition: A | Discharge status: Home / Self Care | Payer: Medicare Other | Attending: Emergency Medicine | Admitting: Emergency Medicine

## 2019-02-10 ENCOUNTER — Emergency Department: Payer: Medicare Other

## 2019-02-10 DIAGNOSIS — Z79899 Other long term (current) drug therapy: Secondary | ICD-10-CM | POA: Insufficient documentation

## 2019-02-10 DIAGNOSIS — Z8673 Personal history of transient ischemic attack (TIA), and cerebral infarction without residual deficits: Secondary | ICD-10-CM | POA: Diagnosis not present

## 2019-02-10 DIAGNOSIS — T148XXA Other injury of unspecified body region, initial encounter: Secondary | ICD-10-CM

## 2019-02-10 DIAGNOSIS — W19XXXA Unspecified fall, initial encounter: Secondary | ICD-10-CM

## 2019-02-10 DIAGNOSIS — Y92019 Unspecified place in single-family (private) house as the place of occurrence of the external cause: Secondary | ICD-10-CM | POA: Diagnosis not present

## 2019-02-10 DIAGNOSIS — Y998 Other external cause status: Secondary | ICD-10-CM | POA: Insufficient documentation

## 2019-02-10 DIAGNOSIS — Z743 Need for continuous supervision: Secondary | ICD-10-CM | POA: Diagnosis not present

## 2019-02-10 DIAGNOSIS — Z7982 Long term (current) use of aspirin: Secondary | ICD-10-CM | POA: Insufficient documentation

## 2019-02-10 DIAGNOSIS — I10 Essential (primary) hypertension: Secondary | ICD-10-CM | POA: Diagnosis not present

## 2019-02-10 DIAGNOSIS — E039 Hypothyroidism, unspecified: Secondary | ICD-10-CM | POA: Insufficient documentation

## 2019-02-10 DIAGNOSIS — W010XXA Fall on same level from slipping, tripping and stumbling without subsequent striking against object, initial encounter: Secondary | ICD-10-CM | POA: Diagnosis not present

## 2019-02-10 DIAGNOSIS — E860 Dehydration: Secondary | ICD-10-CM | POA: Diagnosis not present

## 2019-02-10 DIAGNOSIS — S20221A Contusion of right back wall of thorax, initial encounter: Secondary | ICD-10-CM | POA: Diagnosis not present

## 2019-02-10 DIAGNOSIS — M5489 Other dorsalgia: Secondary | ICD-10-CM | POA: Diagnosis not present

## 2019-02-10 DIAGNOSIS — Z87891 Personal history of nicotine dependence: Secondary | ICD-10-CM | POA: Insufficient documentation

## 2019-02-10 DIAGNOSIS — S299XXA Unspecified injury of thorax, initial encounter: Secondary | ICD-10-CM | POA: Diagnosis present

## 2019-02-10 DIAGNOSIS — R52 Pain, unspecified: Secondary | ICD-10-CM | POA: Diagnosis not present

## 2019-02-10 DIAGNOSIS — Y9389 Activity, other specified: Secondary | ICD-10-CM | POA: Insufficient documentation

## 2019-02-10 LAB — BASIC METABOLIC PANEL
Anion gap: 10 (ref 5–15)
BUN: 34 mg/dL — ABNORMAL HIGH (ref 8–23)
CO2: 21 mmol/L — ABNORMAL LOW (ref 22–32)
Calcium: 9 mg/dL (ref 8.9–10.3)
Chloride: 106 mmol/L (ref 98–111)
Creatinine, Ser: 1.6 mg/dL — ABNORMAL HIGH (ref 0.61–1.24)
GFR calc Af Amer: 42 mL/min — ABNORMAL LOW (ref >60)
GFR calc non Af Amer: 36 mL/min — ABNORMAL LOW (ref >60)
Glucose, Bld: 123 mg/dL — ABNORMAL HIGH (ref 70–99)
Potassium: 4.2 mmol/L (ref 3.5–5.1)
Sodium: 137 mmol/L (ref 135–145)

## 2019-02-10 LAB — CBC
HCT: 42.1 % (ref 39.0–52.0)
Hemoglobin: 14.6 g/dL (ref 13.0–17.0)
MCH: 31.2 pg (ref 26.0–34.0)
MCHC: 34.7 g/dL (ref 30.0–36.0)
MCV: 90 fL (ref 80.0–100.0)
Platelets: 223 10*3/uL (ref 150–400)
RBC: 4.68 MIL/uL (ref 4.22–5.81)
RDW: 13.6 % (ref 11.5–15.5)
WBC: 10.3 10*3/uL (ref 4.0–10.5)
nRBC: 0 % (ref 0.0–0.2)

## 2019-02-10 MED ORDER — HYDROCODONE-ACETAMINOPHEN 5-325 MG PO TABS: 1.0000 | ORAL_TABLET | Freq: Two times a day (BID) | ORAL | 0 refills | Status: AC | PRN

## 2019-02-10 MED ORDER — SODIUM CHLORIDE 0.9 % IV BOLUS
500.0000 mL | Freq: Once | INTRAVENOUS | Status: AC
  Administered 2019-02-10: 500 mL via INTRAVENOUS

## 2019-02-10 MED ORDER — MORPHINE SULFATE (PF) 2 MG/ML IV SOLN
2.0000 mg | Freq: Once | INTRAVENOUS | Status: AC
Start: 2019-02-10 — End: 2019-02-10
  Administered 2019-02-10: 2 mg via INTRAVENOUS
  Filled 2019-02-10: qty 1

## 2019-02-10 MED ORDER — ONDANSETRON HCL 4 MG/2ML IJ SOLN
4.0000 mg | Freq: Once | INTRAMUSCULAR | Status: AC
Start: 2019-02-10 — End: 2019-02-10
  Administered 2019-02-10: 4 mg via INTRAVENOUS
  Filled 2019-02-10: qty 2

## 2019-02-10 NOTE — ED Triage Notes (Signed)
Pt arrival via [facility] from home due to two falls in the last two days. Per EMS, yesterday pt had a fall but felt okay so didn't call EMS. Today pt states that he lost his balance and fell on his back. Pt currently has right back pain.   EMS states no loss of consciousness or neck pain. EMS states he walked well at home.   Dr. Corky Downs at bedside.

## 2019-02-10 NOTE — ED Provider Notes (Signed)
[facility] Emergency Department Provider Note   ____________________________________________    I have reviewed the triage vital signs and the nursing notes.   HISTORY  Chief Complaint Fall and Hip Pain     HPI Isaac Dixon is a 85 y.o. male who presents after a fall.  Patient reports that he fell yesterday but was feeling okay but apparently fell again because he lost his balance, he complains of right lower back pain which is moderate to severe.  However does feel much better when he sits up.  No other injuries reported.  No head injury.  Not on blood thinners.  No nausea vomiting or abdominal pain.  No fevers or chills.  No cough or shortness of breath.  No chest pain or palpitations.  Has not take anything for this  Past Medical History:  Diagnosis Date  . Hypothyroidism   . Osteoarthritis, knee    just left---better after TKR.  Marland Kitchen Rheumatoid arthritis [facility])    sees Dr Jefm Bryant  . TIA (transient ischemic attack) 1980's   negative work up    Patient Active Problem List   Diagnosis Date Noted  . Pain due to onychomycosis of toenails of both feet 12/15/2018  . BPH with obstruction/lower urinary tract symptoms 12/10/2017  . Chronic kidney disease, stage III (moderate) 12/10/2017  . Preventative health care 07/06/2014  . Peripheral neuropathy 07/06/2014  . Advance directive discussed with patient 07/06/2014  . TIA (transient ischemic attack)   . Rheumatoid arthritis ([place])   . Hypothyroidism     Past Surgical History:  Procedure Laterality Date  . INGUINAL HERNIA REPAIR  2013  . TONSILLECTOMY AND ADENOIDECTOMY  1938  . TOTAL KNEE ARTHROPLASTY Left 2006   original 1999 then replaced due to infection    Prior to Admission medications   Medication Sig Start Date End Date Taking? Authorizing Provider  aspirin 81 MG tablet Take 81 mg by mouth daily.    [provider]  etanercept (ENBREL) 50 MG/ML injection Inject 50 mg into the  skin once a week.    [provider]  gabapentin (NEURONTIN) 100 MG capsule Take 1 capsule by mouth 2 (two) times daily. 07/23/17   [provider]  HYDROcodone-acetaminophen (NORCO/VICODIN) 5-325 MG tablet Take 1 tablet by mouth every 12 (twelve) hours as needed for up to 10 days for severe pain. 02/10/19 02/20/19  Lavonia Drafts, MD  levothyroxine (SYNTHROID) 50 MCG tablet TAKE 1 TABLET BY MOUTH EVERY MORNING ON AN EMPTY STOMACH 08/25/18   Venia Carbon, MD  Melatonin 5 MG TABS Take by mouth.    [provider]  tamsulosin (FLOMAX) 0.4 MG CAPS capsule TAKE 1 CAPSULE(0.4 MG) BY MOUTH DAILY 11/21/18   Venia Carbon, MD     Allergies Patient has no known allergies.  Family History  Problem Relation Age of Onset  . COPD Father   . Heart disease Brother     Social History Social History   Tobacco Use  . Smoking status: Former Research scientist (life sciences)  . Smokeless tobacco: Never Used  Substance Use Topics  . Alcohol use: Yes    Alcohol/week: 0.0 standard drinks    Comment: occ  . Drug use: No    Review of Systems  Constitutional: No fever/chills Eyes: No visual changes.  ENT: No neck pain Cardiovascular: Denies chest pain. Respiratory: Denies shortness of breath. Gastrointestinal: No abdominal pain.  No nausea, no vomiting.   Genitourinary: Negative for groin injury Musculoskeletal: As above Skin:  Negative for bruising or abrasion Neurological: No headache, no focal weakness   ____________________________________________   PHYSICAL EXAM:  VITAL SIGNS: ED Triage Vitals  Enc Vitals Group     BP      Pulse      Resp      Temp      Temp src      SpO2      Weight      Height      Head Circumference      Peak Flow      Pain Score      Pain Loc      Pain Edu?      Excl. in [place]?     Constitutional: Alert and oriented. No acute distress. Pleasant and interactive Eyes: Conjunctivae are normal.  Head: Atraumatic.  Nose: No swelling or  epistaxis Mouth/Throat: Mucous membranes are moist.   Neck:  Painless ROM, no pain with axial load Cardiovascular: Normal rate, regular rhythm.  Good peripheral circulation.  No chest wall tenderness palpation Respiratory: Normal respiratory effort.  No retractions.  Gastrointestinal: Soft and nontender. No distention.  Genitourinary: Normal exam Musculoskeletal: Tenderness to the right lumbar paraspinal area, no bruising, no vertebral tenderness to palpation, no swelling.  Normal strength in all extremities.  No pain with axial load on the right hip or left hip. Neurologic:  Normal speech and language. No gross focal neurologic deficits are appreciated.  Skin:  Skin is warm, dry and intact. No rash noted. Psychiatric: Mood and affect are normal. Speech and behavior are normal.  ____________________________________________   LABS (all labs ordered are listed, but only abnormal results are displayed)  Labs Reviewed  BASIC METABOLIC PANEL - Abnormal; Notable for the following components:      Result Value   CO2 21 (*)    Glucose, Bld 123 (*)    BUN 34 (*)    Creatinine, Ser 1.60 (*)    GFR calc non Af Amer 36 (*)    GFR calc Af Amer 42 (*)    All other components within normal limits  CBC   ____________________________________________  EKG  None ____________________________________________  RADIOLOGY  X-ray lumbar spine, pelvis, sacrum ____________________________________________   PROCEDURES  Procedure(s) performed: No  Procedures   Critical Care performed: No ____________________________________________   INITIAL IMPRESSION / ASSESSMENT AND PLAN / ED COURSE  Pertinent labs & imaging results that were available during my care of the patient were reviewed by me and considered in my medical decision making (see chart for details).  Patient presents after mechanical fall, overall well-appearing tenderness as above, pending x-rays, will give low-dose IV morphine,  IV Zofran.  X-rays are reassuring, patient is feeling improved with pain medication, will DC with analgesics, outpatient follow-up, return precautions discussed    ____________________________________________   FINAL CLINICAL IMPRESSION(S) / ED DIAGNOSES  Final diagnoses:  Fall, initial encounter  Back contusion, right, initial encounter  Muscle strain  Dehydration        Note:  This document was prepared using Dragon voice recognition software and may include unintentional dictation errors.   Lavonia Drafts, MD 02/10/19 1249

## 2019-02-11 ENCOUNTER — Telehealth: Payer: Self-pay

## 2019-02-11 NOTE — Telephone Encounter (Signed)
Spoke to pt. Made him an appt for 02-25-19.

## 2019-02-11 NOTE — Telephone Encounter (Signed)
Okay If he is stable getting around, no PT needed. The "trance" could have just been lightheadedness when standing, and not TIA. He should be careful getting out of bed or up from a chair--and hold on for a bit before he starts walking

## 2019-02-11 NOTE — Telephone Encounter (Signed)
Spoke to pt's significant other, Robin. She said when he went down yesterday, he had a moment of being incoherent "trance". He came to quickly. She is wondering if he had a TIA.  She will talk to him about PT at [facility]. They are trying to stay away from other people due to Covid.

## 2019-02-11 NOTE — Telephone Encounter (Signed)
[place] and seen in ER but able to go home  Please check on him  We should consider PT evaluation for safety---let me know if he is willing to do this (would use [facility])

## 2019-02-11 NOTE — Telephone Encounter (Signed)
Set up a follow up (preferably in person) after the new year

## 2019-02-11 NOTE — Telephone Encounter (Signed)
She said she is having maintenance come out and move their bed over so he can use his motorized scooter in the house more.   The trance was while he was on floor after he fell.  Tried to call back but the line was busy.

## 2019-02-25 ENCOUNTER — Ambulatory Visit (INDEPENDENT_AMBULATORY_CARE_PROVIDER_SITE_OTHER): Payer: Medicare Other | Admitting: Internal Medicine

## 2019-02-25 ENCOUNTER — Other Ambulatory Visit: Payer: Self-pay

## 2019-02-25 ENCOUNTER — Encounter: Payer: Self-pay | Admitting: Internal Medicine

## 2019-02-25 VITALS — BP 130/80 | Temp 97.4°F | Ht 64.0 in | Wt 149.0 lb

## 2019-02-25 DIAGNOSIS — R200 Anesthesia of skin: Secondary | ICD-10-CM | POA: Diagnosis not present

## 2019-02-25 DIAGNOSIS — G6289 Other specified polyneuropathies: Secondary | ICD-10-CM

## 2019-02-25 DIAGNOSIS — M549 Dorsalgia, unspecified: Secondary | ICD-10-CM | POA: Diagnosis not present

## 2019-02-25 LAB — VITAMIN B12: Vitamin B-12: 1140 pg/mL — ABNORMAL HIGH (ref 211–911)

## 2019-02-25 NOTE — Assessment & Plan Note (Signed)
[place] and injury Now doing better They are doing everything possible --motorized wheelchair, accommodations in apt, etc Reviewed x-rays from ER--sacrum, lumbar spine, and pelvis----all looked okay Discussed safety

## 2019-02-25 NOTE — Assessment & Plan Note (Addendum)
Stopped the gabapentin --not clearly helping (and could have had side effects) Now using hemp cream on his feet Discussed okay to try capsaicin cream they have heard of I don't see B12 levels--will check that

## 2019-02-25 NOTE — Progress Notes (Signed)
Subjective:    Patient ID: Gianfranco Elick, male    DOB: 06-15-1920, 86 y.o.   MRN: [id number]  HPI Here with wife for ER follow up after fall  This visit occurred during the SARS-CoV-2 public health emergency.  Safety protocols were in place, including screening questions prior to the visit, additional usage of staff PPE, and extensive cleaning of exam room while observing appropriate contact time as indicated for disinfecting solutions.    [place] 12/22 "I just saw black"---had been getting ready to make bed with wife on the other side Wife concerned about TIA---he was on the floor Called cord--EMTs came and brought him to ER Back pain but pain is better  Current Outpatient Medications on File Prior to Visit  Medication Sig Dispense Refill  . aspirin 81 MG tablet Take 81 mg by mouth daily.    Marland Kitchen etanercept (ENBREL) 50 MG/ML injection Inject 50 mg into the skin once a week.    . levothyroxine (SYNTHROID) 50 MCG tablet TAKE 1 TABLET BY MOUTH EVERY MORNING ON AN EMPTY STOMACH 90 tablet 3  . Melatonin 5 MG TABS Take by mouth.    . tamsulosin (FLOMAX) 0.4 MG CAPS capsule TAKE 1 CAPSULE(0.4 MG) BY MOUTH DAILY 90 capsule 3   No current facility-administered medications on file prior to visit.    No Known Allergies  Past Medical History:  Diagnosis Date  . Hypothyroidism   . Osteoarthritis, knee    just left---better after TKR.  Marland Kitchen Rheumatoid arthritis [facility])    sees Dr Jefm Bryant  . TIA (transient ischemic attack) 1980's   negative work up    Past Surgical History:  Procedure Laterality Date  . INGUINAL HERNIA REPAIR  2013  . TONSILLECTOMY AND ADENOIDECTOMY  1938  . TOTAL KNEE ARTHROPLASTY Left 2006   original 1999 then replaced due to infection    Family History  Problem Relation Age of Onset  . COPD Father   . Heart disease Brother     Social History   Socioeconomic History  . Marital status: Significant Other    Spouse name: Not on file  . Number of children: 3  .  Years of education: Not on file  . Highest education level: Not on file  Occupational History  . Occupation: Retired Marine scientist  Tobacco Use  . Smoking status: Former Research scientist (life sciences)  . Smokeless tobacco: Never Used  Substance and Sexual Activity  . Alcohol use: Yes    Alcohol/week: 0.0 standard drinks    Comment: occ  . Drug use: No  . Sexual activity: Not on file  Other Topics Concern  . Not on file  Social History Narrative   Widowed 5/07   Together with [name] since 2008.      Has living will   Health care POA is partner [name]. Son is alternate   Would accept attempts at resuscitation--but considering DNR   No feeding tube if cognitively unaware   Social Determinants of Health   Financial Resource Strain:   . Difficulty of Paying Living Expenses: Not on file  Food Insecurity:   . Worried About Charity fundraiser in the Last Year: Not on file  . Ran Out of Food in the Last Year: Not on file  Transportation Needs:   . Lack of Transportation (Medical): Not on file  . Lack of Transportation (Non-Medical): Not on file  Physical Activity:   . Days of Exercise per Week: Not on file  . Minutes of Exercise per Session:  Not on file  Stress:   . Feeling of Stress : Not on file  Social Connections:   . Frequency of Communication with Friends and Family: Not on file  . Frequency of Social Gatherings with Friends and Family: Not on file  . Attends Religious Services: Not on file  . Active Member of Clubs or Organizations: Not on file  . Attends Archivist Meetings: Not on file  . Marital Status: Not on file  Intimate Partner Violence:   . Fear of Current or Ex-Partner: Not on file  . Emotionally Abused: Not on file  . Physically Abused: Not on file  . Sexually Abused: Not on file   Review of Systems     Objective:   Physical Exam         Assessment & Plan:

## 2019-03-06 DIAGNOSIS — Z23 Encounter for immunization: Secondary | ICD-10-CM | POA: Diagnosis not present

## 2019-03-12 ENCOUNTER — Telehealth: Payer: Self-pay | Admitting: Internal Medicine

## 2019-03-12 NOTE — Telephone Encounter (Signed)
Contacted pt and advised of PCP msg. Pt would like to try prednisone injection in office first. Made apt for 1/26 at 12. Pt's wife confirmed date and time.

## 2019-03-12 NOTE — Telephone Encounter (Signed)
Patient called today  He wanted some advice about an injection before ordering He stated a friends gets an injection in his knees and it seems to help them The Injection is called HYALURONIC-ACID./ He wanted to know if you think he would benefit from getting this.  Please advise

## 2019-03-12 NOTE — Telephone Encounter (Signed)
I don't do those injections but my sports medicine partner, Dr Edilia Bo, does (or an orthopedist can do this). Usually you would try cortisone shots before the hyaluronic acid though (I do do cortisone injections)

## 2019-03-17 ENCOUNTER — Other Ambulatory Visit: Payer: Self-pay

## 2019-03-17 ENCOUNTER — Ambulatory Visit (INDEPENDENT_AMBULATORY_CARE_PROVIDER_SITE_OTHER): Payer: Medicare Other | Admitting: Internal Medicine

## 2019-03-17 ENCOUNTER — Encounter: Payer: Self-pay | Admitting: Internal Medicine

## 2019-03-17 DIAGNOSIS — R29898 Other symptoms and signs involving the musculoskeletal system: Secondary | ICD-10-CM

## 2019-03-17 NOTE — Assessment & Plan Note (Signed)
No clear arthritic etiology--so did not recommend cortisone injections Discussed home quad strengthening program Continues on the enbrel for his RA

## 2019-03-17 NOTE — Progress Notes (Signed)
Subjective:    Patient ID: Isaac Dixon, male    DOB: 1920-05-26, 86 y.o.   MRN: [id number]  HPI  Here to discuss knee injections With wife This visit occurred during the SARS-CoV-2 public health emergency.  Safety protocols were in place, including screening questions prior to the visit, additional usage of staff PPE, and extensive cleaning of exam room while observing appropriate contact time as indicated for disinfecting solutions.   He is concerned about cracking in knees Not overly painful with this---and this is with movement Not walking much--only "taking steps" Very cautious due to fear of falling Does have weakness-using motorized wheelchair in house, and scooter when outside  Also pain in the top of his foot arches  Current Outpatient Medications on File Prior to Visit  Medication Sig Dispense Refill  . aspirin 81 MG tablet Take 81 mg by mouth daily.    Marland Kitchen etanercept (ENBREL) 50 MG/ML injection Inject 50 mg into the skin once a week.    . levothyroxine (SYNTHROID) 50 MCG tablet TAKE 1 TABLET BY MOUTH EVERY MORNING ON AN EMPTY STOMACH 90 tablet 3  . Melatonin 5 MG TABS Take by mouth.    . tamsulosin (FLOMAX) 0.4 MG CAPS capsule TAKE 1 CAPSULE(0.4 MG) BY MOUTH DAILY 90 capsule 3   No current facility-administered medications on file prior to visit.    No Known Allergies  Past Medical History:  Diagnosis Date  . Hypothyroidism   . Osteoarthritis, knee    just left---better after TKR.  Marland Kitchen Rheumatoid arthritis [facility])    sees Dr Jefm Bryant  . TIA (transient ischemic attack) 1980's   negative work up    Past Surgical History:  Procedure Laterality Date  . INGUINAL HERNIA REPAIR  2013  . TONSILLECTOMY AND ADENOIDECTOMY  1938  . TOTAL KNEE ARTHROPLASTY Left 2006   original 1999 then replaced due to infection    Family History  Problem Relation Age of Onset  . COPD Father   . Heart disease Brother     Social History   Socioeconomic History  . Marital status:  Significant Other    Spouse name: Not on file  . Number of children: 3  . Years of education: Not on file  . Highest education level: Not on file  Occupational History  . Occupation: Retired Marine scientist  Tobacco Use  . Smoking status: Former Research scientist (life sciences)  . Smokeless tobacco: Never Used  Substance and Sexual Activity  . Alcohol use: Yes    Alcohol/week: 0.0 standard drinks    Comment: occ  . Drug use: No  . Sexual activity: Not on file  Other Topics Concern  . Not on file  Social History Narrative   Widowed 5/07   Together with [name] since 2008.      Has living will   Health care POA is partner [name]. Son is alternate   Would accept attempts at resuscitation--but considering DNR   No feeding tube if cognitively unaware   Social Determinants of Health   Financial Resource Strain:   . Difficulty of Paying Living Expenses: Not on file  Food Insecurity:   . Worried About Charity fundraiser in the Last Year: Not on file  . Ran Out of Food in the Last Year: Not on file  Transportation Needs:   . Lack of Transportation (Medical): Not on file  . Lack of Transportation (Non-Medical): Not on file  Physical Activity:   . Days of Exercise per Week: Not on file  .  Minutes of Exercise per Session: Not on file  Stress:   . Feeling of Stress : Not on file  Social Connections:   . Frequency of Communication with Friends and Family: Not on file  . Frequency of Social Gatherings with Friends and Family: Not on file  . Attends Religious Services: Not on file  . Active Member of Clubs or Organizations: Not on file  . Attends Archivist Meetings: Not on file  . Marital Status: Not on file  Intimate Partner Violence:   . Fear of Current or Ex-Partner: Not on file  . Emotionally Abused: Not on file  . Physically Abused: Not on file  . Sexually Abused: Not on file   Review of Systems  Continues on the enbrel Did have left TKR Sleeps well Appetite is good     Objective:    Physical Exam  Constitutional: No distress.  Musculoskeletal:     Comments: Left TKR seems in good position Right knee without effusion. No meniscus or ligament findings. Mild crepitus only and not inflamed   Neurological:  Clear leg weakness---symmetric           Assessment & Plan:

## 2019-04-03 DIAGNOSIS — Z23 Encounter for immunization: Secondary | ICD-10-CM | POA: Diagnosis not present

## 2019-08-07 ENCOUNTER — Other Ambulatory Visit: Payer: Self-pay

## 2019-08-07 ENCOUNTER — Emergency Department: Payer: Medicare Other

## 2019-08-07 ENCOUNTER — Inpatient Hospital Stay
Admission: EM | Admit: 2019-08-07 | Discharge: 2019-08-11 | DRG: 871 | Disposition: A | Discharge status: Home Health | Payer: Medicare Other | Attending: Internal Medicine | Admitting: Internal Medicine

## 2019-08-07 DIAGNOSIS — Z7982 Long term (current) use of aspirin: Secondary | ICD-10-CM

## 2019-08-07 DIAGNOSIS — Z79890 Hormone replacement therapy (postmenopausal): Secondary | ICD-10-CM | POA: Diagnosis not present

## 2019-08-07 DIAGNOSIS — R000 Tachycardia, unspecified: Secondary | ICD-10-CM | POA: Diagnosis present

## 2019-08-07 DIAGNOSIS — Z66 Do not resuscitate: Secondary | ICD-10-CM | POA: Diagnosis present

## 2019-08-07 DIAGNOSIS — Z8249 Family history of ischemic heart disease and other diseases of the circulatory system: Secondary | ICD-10-CM | POA: Diagnosis not present

## 2019-08-07 DIAGNOSIS — I129 Hypertensive chronic kidney disease with stage 1 through stage 4 chronic kidney disease, or unspecified chronic kidney disease: Secondary | ICD-10-CM | POA: Diagnosis present

## 2019-08-07 DIAGNOSIS — Z79899 Other long term (current) drug therapy: Secondary | ICD-10-CM

## 2019-08-07 DIAGNOSIS — M069 Rheumatoid arthritis, unspecified: Secondary | ICD-10-CM | POA: Diagnosis present

## 2019-08-07 DIAGNOSIS — R32 Unspecified urinary incontinence: Secondary | ICD-10-CM | POA: Diagnosis present

## 2019-08-07 DIAGNOSIS — H9190 Unspecified hearing loss, unspecified ear: Secondary | ICD-10-CM | POA: Diagnosis present

## 2019-08-07 DIAGNOSIS — Z8673 Personal history of transient ischemic attack (TIA), and cerebral infarction without residual deficits: Secondary | ICD-10-CM

## 2019-08-07 DIAGNOSIS — A419 Sepsis, unspecified organism: Secondary | ICD-10-CM | POA: Diagnosis present

## 2019-08-07 DIAGNOSIS — J9601 Acute respiratory failure with hypoxia: Secondary | ICD-10-CM | POA: Diagnosis present

## 2019-08-07 DIAGNOSIS — N183 Chronic kidney disease, stage 3 unspecified: Secondary | ICD-10-CM | POA: Diagnosis present

## 2019-08-07 DIAGNOSIS — R55 Syncope and collapse: Secondary | ICD-10-CM | POA: Diagnosis present

## 2019-08-07 DIAGNOSIS — G6289 Other specified polyneuropathies: Secondary | ICD-10-CM

## 2019-08-07 DIAGNOSIS — R569 Unspecified convulsions: Secondary | ICD-10-CM | POA: Diagnosis present

## 2019-08-07 DIAGNOSIS — Z20822 Contact with and (suspected) exposure to covid-19: Secondary | ICD-10-CM | POA: Diagnosis present

## 2019-08-07 DIAGNOSIS — Z825 Family history of asthma and other chronic lower respiratory diseases: Secondary | ICD-10-CM | POA: Diagnosis not present

## 2019-08-07 DIAGNOSIS — Z96652 Presence of left artificial knee joint: Secondary | ICD-10-CM | POA: Diagnosis present

## 2019-08-07 DIAGNOSIS — E039 Hypothyroidism, unspecified: Secondary | ICD-10-CM | POA: Diagnosis present

## 2019-08-07 DIAGNOSIS — I248 Other forms of acute ischemic heart disease: Secondary | ICD-10-CM | POA: Diagnosis present

## 2019-08-07 DIAGNOSIS — J690 Pneumonitis due to inhalation of food and vomit: Secondary | ICD-10-CM | POA: Diagnosis present

## 2019-08-07 DIAGNOSIS — G629 Polyneuropathy, unspecified: Secondary | ICD-10-CM | POA: Diagnosis present

## 2019-08-07 DIAGNOSIS — Z9981 Dependence on supplemental oxygen: Secondary | ICD-10-CM | POA: Diagnosis not present

## 2019-08-07 DIAGNOSIS — Z87891 Personal history of nicotine dependence: Secondary | ICD-10-CM | POA: Diagnosis not present

## 2019-08-07 DIAGNOSIS — N1832 Chronic kidney disease, stage 3b: Secondary | ICD-10-CM | POA: Diagnosis not present

## 2019-08-07 LAB — CBC WITH DIFFERENTIAL/PLATELET
Abs Immature Granulocytes: 0.06 10*3/uL (ref 0.00–0.07)
Basophils Absolute: 0.1 10*3/uL (ref 0.0–0.1)
Basophils Relative: 1 %
Eosinophils Absolute: 0.1 10*3/uL (ref 0.0–0.5)
Eosinophils Relative: 1 %
HCT: 43.8 % (ref 39.0–52.0)
Hemoglobin: 14.6 g/dL (ref 13.0–17.0)
Immature Granulocytes: 1 %
Lymphocytes Relative: 23 %
Lymphs Abs: 3 10*3/uL (ref 0.7–4.0)
MCH: 31 pg (ref 26.0–34.0)
MCHC: 33.3 g/dL (ref 30.0–36.0)
MCV: 93 fL (ref 80.0–100.0)
Monocytes Absolute: 1 10*3/uL (ref 0.1–1.0)
Monocytes Relative: 8 %
Neutro Abs: 8.6 10*3/uL — ABNORMAL HIGH (ref 1.7–7.7)
Neutrophils Relative %: 66 %
Platelets: 154 10*3/uL (ref 150–400)
RBC: 4.71 MIL/uL (ref 4.22–5.81)
RDW: 13.2 % (ref 11.5–15.5)
WBC: 12.7 10*3/uL — ABNORMAL HIGH (ref 4.0–10.5)
nRBC: 0 % (ref 0.0–0.2)

## 2019-08-07 LAB — COMPREHENSIVE METABOLIC PANEL
ALT: 20 U/L (ref 0–44)
AST: 23 U/L (ref 15–41)
Albumin: 3.7 g/dL (ref 3.5–5.0)
Alkaline Phosphatase: 73 U/L (ref 38–126)
Anion gap: 12 (ref 5–15)
BUN: 30 mg/dL — ABNORMAL HIGH (ref 8–23)
CO2: 21 mmol/L — ABNORMAL LOW (ref 22–32)
Calcium: 8.9 mg/dL (ref 8.9–10.3)
Chloride: 104 mmol/L (ref 98–111)
Creatinine, Ser: 1.92 mg/dL — ABNORMAL HIGH (ref 0.61–1.24)
GFR calc Af Amer: 33 mL/min — ABNORMAL LOW (ref >60)
GFR calc non Af Amer: 29 mL/min — ABNORMAL LOW (ref >60)
Glucose, Bld: 170 mg/dL — ABNORMAL HIGH (ref 70–99)
Potassium: 3.9 mmol/L (ref 3.5–5.1)
Sodium: 137 mmol/L (ref 135–145)
Total Bilirubin: 0.7 mg/dL (ref 0.3–1.2)
Total Protein: 7.9 g/dL (ref 6.5–8.1)

## 2019-08-07 LAB — LACTIC ACID, PLASMA
Lactic Acid, Venous: 2.7 mmol/L (ref 0.5–1.9)
Lactic Acid, Venous: 2.6 mmol/L (ref 0.5–1.9)

## 2019-08-07 LAB — SARS CORONAVIRUS 2 BY RT PCR (HOSPITAL ORDER, PERFORMED IN ~~LOC~~ HOSPITAL LAB): SARS Coronavirus 2: NEGATIVE

## 2019-08-07 LAB — TROPONIN I (HIGH SENSITIVITY): Troponin I (High Sensitivity): 129 ng/L (ref <18)

## 2019-08-07 LAB — BRAIN NATRIURETIC PEPTIDE: B Natriuretic Peptide: 177 pg/mL — ABNORMAL HIGH (ref 0.0–100.0)

## 2019-08-07 MED ORDER — SODIUM CHLORIDE 0.9 % IV SOLN
3.0000 g | Freq: Once | INTRAVENOUS | Status: AC
  Administered 2019-08-07: 3 g via INTRAVENOUS
  Filled 2019-08-07: qty 8

## 2019-08-07 MED ORDER — PIPERACILLIN-TAZOBACTAM 3.375 G IVPB: 3.3750 g | Freq: Three times a day (TID) | INTRAVENOUS | Status: DC

## 2019-08-07 MED ORDER — LEVETIRACETAM IN NACL 500 MG/100ML IV SOLN
500.0000 mg | Freq: Once | INTRAVENOUS | Status: AC
  Administered 2019-08-07: 500 mg via INTRAVENOUS
  Filled 2019-08-07: qty 100

## 2019-08-07 MED ORDER — PIPERACILLIN-TAZOBACTAM 3.375 G IVPB
3.3750 g | Freq: Two times a day (BID) | INTRAVENOUS | Status: DC
  Administered 2019-08-08 – 2019-08-10 (×6): 3.375 g via INTRAVENOUS
  Filled 2019-08-07 (×6): qty 50

## 2019-08-07 MED ORDER — SODIUM CHLORIDE 0.9 % IV BOLUS
500.0000 mL | Freq: Once | INTRAVENOUS | Status: AC
  Administered 2019-08-07: 500 mL via INTRAVENOUS

## 2019-08-07 NOTE — ED Notes (Signed)
Significant other of pt called another RN due to a seizure. Another RN stated the seizure was not tonic clonic and that he started to foam a little at the mouth and that his eyes vered off. Pt states he now feels great. ER doctor was at bedside and set oxygen to 3.5lpm via [place]. pts oxygen saturation is 93-94%.

## 2019-08-07 NOTE — ED Notes (Signed)
Sharion Settler, NP at bedside

## 2019-08-07 NOTE — Progress Notes (Signed)
Pharmacy Antibiotic Note  Cyris Maalouf is a 86 y.o. male admitted on 08/07/2019 with pneumonia.  Pharmacy has been consulted for Zosyn dosing.  Plan: Patient received a dose of Unasyn 3g IV x 1 in the ED.  Patient has a h/o of CKD currently AKI on CKD w/ Scr of 1.92 (baseline ~ 1.5 - 1.6). Will start patient on zosyn 3.375g IV q12h per CrCl < 20 ml/min, patient is not on dialysis. Will continue to monitor and adjust doses per changes in renal function.   Height: 5\' 5"  (165.1 cm) Weight: 63.5 kg (140 lb) IBW/kg (Calculated) : 61.5  Temp (24hrs), Avg:97.9 F (36.6 C), Min:97.9 F (36.6 C), Max:97.9 F (36.6 C)  Recent Labs  Lab 08/07/19 1858 08/07/19 1941  WBC 12.7*  --   CREATININE 1.92*  --   LATICACIDVEN  --  2.7*    Estimated Creatinine Clearance: 19.1 mL/min (A) (by C-G formula based on SCr of 1.92 mg/dL (H)).    No Known Allergies   Thank you for allowing pharmacy to be a part of this patient's care.  Tobie Lords, PharmD, BCPS Clinical Pharmacist 08/07/2019 10:28 PM

## 2019-08-07 NOTE — ED Notes (Signed)
ER secretary paged Dr. Jonelle Sidle to call RN back to inform pt of critical Lactic Acid. Provider has not responded to page at this time.

## 2019-08-07 NOTE — H&P (Signed)
History and Physical   Isaac Dixon IDP:[id number] DOB: 09-06-1920 DOA: 08/07/2019  Referring MD/NP/PA: Dr. Quentin Cornwall  PCP: Venia Carbon, MD   Outpatient Specialists: [facility]  Patient coming from: Home  Chief Complaint: Passing out  HPI: Isaac Dixon is a 86 y.o. male with medical history significant of hypothyroidism, rheumatoid arthritis, TIAs, peripheral neuropathy, chronic kidney disease stage III, who is remarkably healthy for his age presenting with complaint of syncopal episode at home.  Patient was seen by EMS and on arrival in the ER was found to be hypoxic requiring 3 L of oxygen in the beginning.  This is new for the patient.  While still in the ER he was noted to have another episode of altered mental status but this appears to be more tonic-clonic seizures with frothing out.  Patient had loss of bladder control during the episode.  No prior history of seizures.  No history of previous syncopal episode but did have some history of TIA in the past.  Patient is now back to baseline however noted to have significant tachycardia and hypotension.  Chest x-ray and other evaluation indicated possible aspiration pneumonitis versus CHF.  Patient is being admitted to the hospital for evaluation and treatment.  He is fully awake and alert but hard of hearing.  No other complaints at this point and appears to be fully oriented x3.Marland Kitchen  ED Course: Temperature 97.9 blood pressure 89/56 initially currently 97/73 pulse 147 respiratory rate of 45 oxygen sats 96% room air.  Lactic acid of 2.7.  COVID-19 is negative.  CT cervical spine and CT head without contrast all within normal.  BNP 177 troponin I 29.  Chemistry showed glucose of 170 creatinine 1.9 and BUN 30.  White count showed 12.7 otherwise the rest of the CBC is within normal.  Chest x-ray showed diffusely increased interstitial markings possible interstitial edema or infectious etiology.  Patient suspected to have had aspiration  pneumonia he is being admitted to the hospital for treatment.  Review of Systems: As per HPI otherwise 10 point review of systems negative.    Past Medical History:  Diagnosis Date  . Hypothyroidism   . Osteoarthritis, knee    just left---better after TKR.  Marland Kitchen Rheumatoid arthritis [facility])    sees Dr [facility]  . TIA (transient ischemic attack) 1980's   negative work up    Past Surgical History:  Procedure Laterality Date  . INGUINAL HERNIA REPAIR  2013  . TONSILLECTOMY AND ADENOIDECTOMY  1938  . TOTAL KNEE ARTHROPLASTY Left 2006   original 1999 then replaced due to infection     reports that he has quit smoking. He has never used smokeless tobacco. He reports current alcohol use. He reports that he does not use drugs.  No Known Allergies  Family History  Problem Relation Age of Onset  . COPD Father   . Heart disease Brother      Prior to Admission medications   Medication Sig Start Date End Date Taking? Authorizing Provider  aspirin 81 MG tablet Take 81 mg by mouth daily.    [provider]  etanercept (ENBREL) 50 MG/ML injection Inject 50 mg into the skin once a week.    [provider]  levothyroxine (SYNTHROID) 50 MCG tablet TAKE 1 TABLET BY MOUTH EVERY MORNING ON AN EMPTY STOMACH 08/25/18   Venia Carbon, MD  Melatonin 5 MG TABS Take by mouth.    [provider]  tamsulosin (FLOMAX) 0.4 MG CAPS capsule TAKE 1  CAPSULE(0.4 MG) BY MOUTH DAILY 11/21/18   Venia Carbon, MD    Physical Exam: Vitals:   08/07/19 2152 08/07/19 2153 08/07/19 2154 08/07/19 2155  BP:      Pulse: 80 (!) 41 (!) 43 76  Resp: (!) 22 (!) 23 (!) 24 (!) 23  Temp:      TempSrc:      SpO2: 92% 94% 93% 91%  Weight:      Height:          Constitutional: Remarkably sharp and healthy for his age, no distress Vitals:   08/07/19 2152 08/07/19 2153 08/07/19 2154 08/07/19 2155  BP:      Pulse: 80 (!) 41 (!) 43 76  Resp: (!) 22 (!) 23 (!) 24 (!) 23  Temp:        TempSrc:      SpO2: 92% 94% 93% 91%  Weight:      Height:       Eyes: PERRL, lids and conjunctivae normal ENMT: Mucous membranes are moist. Posterior pharynx clear of any exudate or lesions.Normal dentition.  Neck: normal, supple, no masses, no thyromegaly Respiratory: Coarse breath sounds bilaterally, no wheezing, mild basal crackles. Normal respiratory effort. No accessory muscle use.  Cardiovascular: Sinus tachycardia, no murmurs / rubs / gallops. No extremity edema. 2+ pedal pulses. No carotid bruits.  Abdomen: no tenderness, no masses palpated. No hepatosplenomegaly. Bowel sounds positive.  Musculoskeletal: no clubbing / cyanosis. No joint deformity upper and lower extremities. Good ROM, no contractures. Normal muscle tone.  Skin: no rashes, lesions, ulcers. No induration Neurologic: CN 2-12 grossly intact. Sensation intact, DTR normal. Strength 5/5 in all 4.  Psychiatric: Normal judgment and insight. Alert and oriented x 3. Normal mood.     Labs on Admission: I have personally reviewed following labs and imaging studies  CBC: Recent Labs  Lab 08/07/19 1858  WBC 12.7*  NEUTROABS 8.6*  HGB 14.6  HCT 43.8  MCV 93.0  PLT 347   Basic Metabolic Panel: Recent Labs  Lab 08/07/19 1858  NA 137  K 3.9  CL 104  CO2 21*  GLUCOSE 170*  BUN 30*  CREATININE 1.92*  CALCIUM 8.9   GFR: Estimated Creatinine Clearance: 19.1 mL/min (A) (by C-G formula based on SCr of 1.92 mg/dL (H)). Liver Function Tests: Recent Labs  Lab 08/07/19 1858  AST 23  ALT 20  ALKPHOS 73  BILITOT 0.7  PROT 7.9  ALBUMIN 3.7   No results for input(s): LIPASE, AMYLASE in the last 168 hours. No results for input(s): AMMONIA in the last 168 hours. Coagulation Profile: No results for input(s): INR, PROTIME in the last 168 hours. Cardiac Enzymes: No results for input(s): CKTOTAL, CKMB, CKMBINDEX, TROPONINI in the last 168 hours. BNP (last 3 results) No results for input(s): PROBNP in the last 8760  hours. HbA1C: No results for input(s): HGBA1C in the last 72 hours. CBG: No results for input(s): GLUCAP in the last 168 hours. Lipid Profile: No results for input(s): CHOL, HDL, LDLCALC, TRIG, CHOLHDL, LDLDIRECT in the last 72 hours. Thyroid Function Tests: No results for input(s): TSH, T4TOTAL, FREET4, T3FREE, THYROIDAB in the last 72 hours. Anemia Panel: No results for input(s): VITAMINB12, FOLATE, FERRITIN, TIBC, IRON, RETICCTPCT in the last 72 hours. Urine analysis:    Component Value Date/Time   BILIRUBINUR Negative 09/30/2017 0958   PROTEINUR Positive (A) 09/30/2017 0958   UROBILINOGEN 0.2 09/30/2017 0958   NITRITE Positive 09/30/2017 0958   LEUKOCYTESUR Large (3+) (A) 09/30/2017 4259  Sepsis Labs: @LABRCNTIP (procalcitonin:4,lacticidven:4) ) Recent Results (from the past 240 hour(s))  SARS Coronavirus 2 by RT PCR (hospital order, performed in [facility] hospital lab) Nasopharyngeal Nasopharyngeal Swab     Status: None   Collection Time: 08/07/19  7:55 PM   Specimen: Nasopharyngeal Swab  Result Value Ref Range Status   SARS Coronavirus 2 NEGATIVE NEGATIVE Final    Comment: (NOTE) SARS-CoV-2 target nucleic acids are NOT DETECTED.  The SARS-CoV-2 RNA is generally detectable in upper and lower respiratory specimens during the acute phase of infection. The lowest concentration of SARS-CoV-2 viral copies this assay can detect is 250 copies / mL. A negative result does not preclude SARS-CoV-2 infection and should not be used as the sole basis for treatment or other patient management decisions.  A negative result may occur with improper specimen collection / handling, submission of specimen other than nasopharyngeal swab, presence of viral mutation(s) within the areas targeted by this assay, and inadequate number of viral copies (<250 copies / mL). A negative result must be combined with clinical observations, patient history, and epidemiological information.  Fact Sheet  for Patients:   StrictlyIdeas.no  Fact Sheet for Healthcare Providers: BankingDealers.co.za  This test is not yet approved or  cleared by the [place] FDA and has been authorized for detection and/or diagnosis of SARS-CoV-2 by FDA under an Emergency Use Authorization (EUA).  This EUA will remain in effect (meaning this test can be used) for the duration of the COVID-19 declaration under Section 564(b)(1) of the Act, 21 U.S.C. section 360bbb-3(b)(1), unless the authorization is terminated or revoked sooner.  Performed at [facility], [address] [place], [place] [address]      Radiological Exams on Admission: CT Head Wo Contrast  Result Date: 08/07/2019 CLINICAL DATA:  Possible seizure and subsequent fall. EXAM: CT HEAD WITHOUT CONTRAST TECHNIQUE: Contiguous axial images were obtained from the base of the skull through the vertex without intravenous contrast. COMPARISON:  None. FINDINGS: Brain: There is moderate severity cerebral atrophy with widening of the extra-axial spaces and ventricular dilatation. There are areas of decreased attenuation within the white matter tracts of the supratentorial brain, consistent with microvascular disease changes. A small area of cortical encephalomalacia, with adjacent chronic white matter low attenuation, is seen within the cerebellum on the right. Vascular: No hyperdense vessel or unexpected calcification. Skull: Normal. Negative for fracture or focal lesion. Sinuses/Orbits: No acute finding. Other: None. IMPRESSION: 1. Generalized cerebral atrophy. 2. No acute intracranial abnormality. Electronically Signed   By: Virgina Norfolk M.D.   On: 08/07/2019 20:36   CT Cervical Spine Wo Contrast  Result Date: 08/07/2019 CLINICAL DATA:  Possible seizure with subsequent fall. EXAM: CT CERVICAL SPINE WITHOUT CONTRAST TECHNIQUE: Multidetector CT imaging of the cervical spine was performed without  intravenous contrast. Multiplanar CT image reconstructions were also generated. COMPARISON:  None. FINDINGS: Alignment: Normal. Skull base and vertebrae: No acute fracture. No primary bone lesion or focal pathologic process. Soft tissues and spinal canal: No prevertebral fluid or swelling. No visible canal hematoma. Disc levels: Moderate to marked severity endplate sclerosis is seen at the levels of C4-C5, C5-C6 and C6-C7. Marked severity intervertebral disc space narrowing is also seen at these levels. Moderate severity bilateral multilevel facet joint hypertrophy is seen. Upper chest: Negative. Other: None. IMPRESSION: 1. No acute osseous abnormality. 2. Marked severity multilevel degenerative disc disease and facet joint hypertrophy. Electronically Signed   By: Virgina Norfolk M.D.   On: 08/07/2019 20:39   DG  Chest Portable 1 View  Result Date: 08/07/2019 CLINICAL DATA:  Shortness of breath, syncope EXAM: PORTABLE CHEST 1 VIEW COMPARISON:  February 26, 2018 FINDINGS: There is mild cardiomegaly. Aortic knob calcifications. Prominence of the central pulmonary vasculature with mildly increased interstitial markings seen throughout both lungs. There are chronic bronchial wall thickening and interstitial markings at both lung bases. No pleural effusion. No acute osseous abnormality. IMPRESSION: Diffusely increased interstitial markings which could be due to interstitial edema and/or infectious etiology. Electronically Signed   By: Prudencio Pair M.D.   On: 08/07/2019 19:28    EKG: Independently reviewed.  It shows sinus tachycardia with a rate up to 140s, significant abnormalities with prolonged PR QT intervals.  Assessment/Plan Principal Problem:   Sepsis ([place]) Active Problems:   Rheumatoid arthritis ([facility])   Hypothyroidism   Peripheral neuropathy   Chronic kidney disease, stage III (moderate)   Syncope     #1 sepsis due to aspiration pneumonia: Patient had syncope and probably seizure with  suspected aspiration.  Symptoms now that of sepsis with tachycardia chest x-ray findings mild leukocytosis and hypoxia.  We will admit the patient and empirically start of IV antibiotics.  Work-up for his sepsis as well as seizures.  Cultures obtained.  #2 syncope versus seizure: Patient observed to have seizure in the ER.  Cause is unclear.  We will get echocardiogram as well as MRI of the brain.  Hypoxia may be a cause and patient is currently on oxygen.  We will titrate him off as we treat.  #3 rheumatoid arthritis: Confirm on resume home regimen.  #4 hypothyroidism: Confirm and resume levothyroxine.  #5 peripheral neuropathy: Stable.  Continue home regimen  #6 chronic kidney disease stage III: Appears to be close to baseline.  Continue monitoring  #7 elevated troponin: No chest pain.  We will cycle enzymes.  Echocardiogram to be done.  DVT prophylaxis: Lovenox Code Status: Full code Family Communication: No family at bedside Disposition Plan: To be determined Consults called: None Admission status: Inpatient  Severity of Illness: The appropriate patient status for this patient is INPATIENT. Inpatient status is judged to be reasonable and necessary in order to provide the required intensity of service to ensure the patient's safety. The patient's presenting symptoms, physical exam findings, and initial radiographic and laboratory data in the context of their chronic comorbidities is felt to place them at high risk for further clinical deterioration. Furthermore, it is not anticipated that the patient will be medically stable for discharge from the hospital within 2 midnights of admission. The following factors support the patient status of inpatient.   " The patient's presenting symptoms include passing out. " The worrisome physical exam findings include sinus tachycardia. " The initial radiographic and laboratory data are worrisome because of x-ray findings suggestive of pneumonia  versus CHF. " The chronic co-morbidities include hypertension.   * I certify that at the point of admission it is my clinical judgment that the patient will require inpatient hospital care spanning beyond 2 midnights from the point of admission due to high intensity of service, high risk for further deterioration and high frequency of surveillance required.Barbette Merino MD [facility] Pager [phone]  If 7PM-7AM, please contact night-coverage [email or web address] Password [facility]  08/07/2019, 10:17 PM
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
## 2019-08-07 NOTE — ED Notes (Signed)
pts oxygen saturating at 88% on 3LPM via [place]. Oxygen upped to 4LPM via [place] and is saturating at 90%

## 2019-08-07 NOTE — ED Provider Notes (Addendum)
[facility] Emergency Department Provider Note    First MD Initiated Contact with Patient 08/07/19 1850     (approximate)  I have reviewed the triage vital signs and the nursing notes.   HISTORY  Chief Complaint syncope   HPI Isaac Dixon is a 86 y.o. male   presents to the ER via EMS after witnessed syncopal episode patient became unresponsive reportedly with frothing of the mouth.  On arrival to ER patient feels well states that he has good strength but is hypoxic requiring 3 L nasal cannula.  Has not had any cough.  No history of seizures.  Denies any numbness or tingling.  Denies any headache.  Denies any abdominal pain.  No dysuria.   Past Medical History:  Diagnosis Date  . Hypothyroidism   . Osteoarthritis, knee    just left---better after TKR.  Marland Kitchen Rheumatoid arthritis [facility])    sees Dr Jefm Bryant  . TIA (transient ischemic attack) 1980's   negative work up   Family History  Problem Relation Age of Onset  . COPD Father   . Heart disease Brother    Past Surgical History:  Procedure Laterality Date  . INGUINAL HERNIA REPAIR  2013  . TONSILLECTOMY AND ADENOIDECTOMY  1938  . TOTAL KNEE ARTHROPLASTY Left 2006   original 1999 then replaced due to infection   Patient Active Problem List   Diagnosis Date Noted  . Syncope 08/07/2019  . Leg weakness 03/17/2019  . Back pain 02/25/2019  . Pain due to onychomycosis of toenails of both feet 12/15/2018  . BPH with obstruction/lower urinary tract symptoms 12/10/2017  . Chronic kidney disease, stage III (moderate) 12/10/2017  . Preventative health care 07/06/2014  . Peripheral neuropathy 07/06/2014  . Advance directive discussed with patient 07/06/2014  . TIA (transient ischemic attack)   . Rheumatoid arthritis ([place])   . Hypothyroidism       Prior to Admission medications   Medication Sig Start Date End Date Taking? Authorizing Provider  aspirin 81 MG tablet Take 81 mg by mouth daily.     [provider]  etanercept (ENBREL) 50 MG/ML injection Inject 50 mg into the skin once a week.    [provider]  levothyroxine (SYNTHROID) 50 MCG tablet TAKE 1 TABLET BY MOUTH EVERY MORNING ON AN EMPTY STOMACH 08/25/18   Venia Carbon, MD  Melatonin 5 MG TABS Take by mouth.    [provider]  tamsulosin (FLOMAX) 0.4 MG CAPS capsule TAKE 1 CAPSULE(0.4 MG) BY MOUTH DAILY 11/21/18   Venia Carbon, MD    Allergies Patient has no known allergies.    Social History Social History   Tobacco Use  . Smoking status: Former Research scientist (life sciences)  . Smokeless tobacco: Never Used  Substance Use Topics  . Alcohol use: Yes    Alcohol/week: 0.0 standard drinks    Comment: occ  . Drug use: No    Review of Systems Patient denies headaches, rhinorrhea, blurry vision, numbness, shortness of breath, chest pain, edema, cough, abdominal pain, nausea, vomiting, diarrhea, dysuria, fevers, rashes or hallucinations unless otherwise stated above in HPI. ____________________________________________   PHYSICAL EXAM:  VITAL SIGNS: Vitals:   08/07/19 2030 08/07/19 2041  BP: (!) 135/91   Pulse: (!) 147 (!) 121  Resp: (!) 34   Temp:    SpO2: 95%     Constitutional: Alert and oriented.  Eyes: Conjunctivae are normal.  Head: Atraumatic. Nose: No congestion/rhinnorhea. Mouth/Throat: Mucous membranes are moist.   Neck:  No stridor. Painless ROM.  Cardiovascular: Normal rate, regular rhythm. Grossly normal heart sounds.  Good peripheral circulation. Respiratory: Normal respiratory effort.  No retractions. Lungs CTAB. Gastrointestinal: Soft and nontender. No distention. No abdominal bruits. No CVA tenderness. Genitourinary:  Musculoskeletal: No lower extremity tenderness nor edema.  No joint effusions. Neurologic:  CN- intact.  No facial droop, Normal FNF.  Normal heel to shin.  Sensation intact bilaterally. Normal speech and language. No gross focal neurologic deficits are  appreciated. No gait instability. Skin:  Skin is warm, dry and intact. No rash noted. Psychiatric: Mood and affect are normal. Speech and behavior are normal.  ____________________________________________   LABS (all labs ordered are listed, but only abnormal results are displayed)  Results for orders placed or performed during the hospital encounter of 08/07/19 (from the past 24 hour(s))  CBC with Differential/Platelet     Status: Abnormal   Collection Time: 08/07/19  6:58 PM  Result Value Ref Range   WBC 12.7 (H) 4.0 - 10.5 K/uL   RBC 4.71 4.22 - 5.81 MIL/uL   Hemoglobin 14.6 13.0 - 17.0 g/dL   HCT 43.8 39 - 52 %   MCV 93.0 80.0 - 100.0 fL   MCH 31.0 26.0 - 34.0 pg   MCHC 33.3 30.0 - 36.0 g/dL   RDW 13.2 11.5 - 15.5 %   Platelets 154 150 - 400 K/uL   nRBC 0.0 0.0 - 0.2 %   Neutrophils Relative % 66 %   Neutro Abs 8.6 (H) 1.7 - 7.7 K/uL   Lymphocytes Relative 23 %   Lymphs Abs 3.0 0.7 - 4.0 K/uL   Monocytes Relative 8 %   Monocytes Absolute 1.0 0 - 1 K/uL   Eosinophils Relative 1 %   Eosinophils Absolute 0.1 0 - 0 K/uL   Basophils Relative 1 %   Basophils Absolute 0.1 0 - 0 K/uL   Immature Granulocytes 1 %   Abs Immature Granulocytes 0.06 0.00 - 0.07 K/uL  Comprehensive metabolic panel     Status: Abnormal   Collection Time: 08/07/19  6:58 PM  Result Value Ref Range   Sodium 137 135 - 145 mmol/L   Potassium 3.9 3.5 - 5.1 mmol/L   Chloride 104 98 - 111 mmol/L   CO2 21 (L) 22 - 32 mmol/L   Glucose, Bld 170 (H) 70 - 99 mg/dL   BUN 30 (H) 8 - 23 mg/dL   Creatinine, Ser 1.92 (H) 0.61 - 1.24 mg/dL   Calcium 8.9 8.9 - 10.3 mg/dL   Total Protein 7.9 6.5 - 8.1 g/dL   Albumin 3.7 3.5 - 5.0 g/dL   AST 23 15 - 41 U/L   ALT 20 0 - 44 U/L   Alkaline Phosphatase 73 38 - 126 U/L   Total Bilirubin 0.7 0.3 - 1.2 mg/dL   GFR calc non Af Amer 29 (L) >60 mL/min   GFR calc Af Amer 33 (L) >60 mL/min   Anion gap 12 5 - 15   ____________________________________________  EKG My  review and personal interpretation at Time: 19:00   Indication: seizure like activity  Rate: 125  Rhythm: sinus Axis: normal Other: nonspecific st abn, no stemi, borderline prolonged qt,  ____________________________________________  RADIOLOGY  I personally reviewed all radiographic images ordered to evaluate for the above acute complaints and reviewed radiology reports and findings.  These findings were personally discussed with the patient.  Please see medical record for radiology report.  ____________________________________________   PROCEDURES  Procedure(s) performed:  .  Critical Care Performed by: Merlyn Lot, MD Authorized by: Merlyn Lot, MD   Critical care provider statement:    Critical care time (minutes):  35   Critical care was necessary to treat or prevent imminent or life-threatening deterioration of the following conditions:  Respiratory failure   Critical care was time spent personally by me on the following activities:  Discussions with consultants, evaluation of patient's response to treatment, examination of patient, ordering and performing treatments and interventions, ordering and review of laboratory studies, ordering and review of radiographic studies, pulse oximetry, re-evaluation of patient's condition, obtaining history from patient or surrogate and review of old charts      Critical Care performed: yes ____________________________________________   INITIAL IMPRESSION / ASSESSMENT AND PLAN / ED COURSE  Pertinent labs & imaging results that were available during my care of the patient were reviewed by me and considered in my medical decision making (see chart for details).   DDX: Dehydration, sepsis, pna, uti, hypoglycemia, cva, drug effect, withdrawal, encephalitis   Chirag Krueger is a 86 y.o. who presents to the ED with symptoms as described above.  Patient appears much younger than stated age.  On arrival he nontoxic-appearing but does  have fairly significant O2 requirement without any history of CHF or COPD.  Based on history I am concerned for possible aspiration or pneumonia then will order blood work.  Supplemental O2 provided for his acute respiratory failure with hypoxia.  He is denying any chest pain abdominal pain nausea or vomiting.  CT imaging of head and neck will be ordered to evaluate for acute intracranial abnormality.  Clinical Course as of Aug 06 2099  Fri Aug 07, 2019  2036 Was called to the bedside for patient had another episode.  Does not clinically appear to be having seizure.  Lost control of his bladder.  Did have a pulse during this episode did not have any desaturation however patient was not on the telemetry monitor at that time due to being brought back from CT.  After roughly 1 minute of shaking patient abated.  Had brief postictal period is now back to baseline.  I am covering for aspiration pneumonia given his infiltrate on chest x-ray.  Will give gentle IV hydration.  Patient family reiterate that he is DNI DNR.   [PR]    Clinical Course User Index [PR] Merlyn Lot, MD    The patient was evaluated in Emergency Department today for the symptoms described in the history of present illness. He/she was evaluated in the context of the global COVID-19 pandemic, which necessitated consideration that the patient might be at risk for infection with the SARS-CoV-2 virus that causes COVID-19. Institutional protocols and algorithms that pertain to the evaluation of patients at risk for COVID-19 are in a state of rapid change based on information released by regulatory bodies including the CDC and federal and state organizations. These policies and algorithms were followed during the patient's care in the ED.  As part of my medical decision making, I reviewed the following data within the [place] notes reviewed and incorporated, Labs reviewed, notes from prior ED visits and [place]  Controlled Substance Database   ____________________________________________   FINAL CLINICAL IMPRESSION(S) / ED DIAGNOSES  Final diagnoses:  Acute respiratory failure with hypoxia ([place])  Seizure-like activity ([place])      NEW MEDICATIONS STARTED DURING THIS VISIT:  New Prescriptions   No medications on file     Note:  This document was  prepared using Systems analyst and may include unintentional dictation errors.    Merlyn Lot, MD 08/07/19 5035    Merlyn Lot, MD 08/07/19 2101

## 2019-08-07 NOTE — ED Notes (Signed)
Pt states coming in due to a fall. Pt states he does not recall if he hit his head. Pt states that his upper body is strong, and he is normally strong and is feeling better. Pt states his neuropathy was acting up and while trying to transfer he fell.

## 2019-08-07 NOTE — ED Notes (Signed)
Significant other took pts hearing aids

## 2019-08-07 NOTE — ED Notes (Signed)
Provider has not responded to page or secure chat to inform him of critical lactic acid or that the admit RNs would like medications for his heart rate.

## 2019-08-07 NOTE — ED Notes (Addendum)
Isaac Settler, NP aware of both Lactic Acid of 2.7 and Troponin of 129. Provider has changed pt status to progressive care. Provider was paged.

## 2019-08-08 ENCOUNTER — Inpatient Hospital Stay: Payer: Medicare Other

## 2019-08-08 ENCOUNTER — Other Ambulatory Visit: Payer: Self-pay

## 2019-08-08 ENCOUNTER — Encounter: Payer: Self-pay | Admitting: Internal Medicine

## 2019-08-08 LAB — COMPREHENSIVE METABOLIC PANEL
ALT: 20 U/L (ref 0–44)
AST: 28 U/L (ref 15–41)
Albumin: 3.3 g/dL — ABNORMAL LOW (ref 3.5–5.0)
Alkaline Phosphatase: 63 U/L (ref 38–126)
Anion gap: 11 (ref 5–15)
BUN: 34 mg/dL — ABNORMAL HIGH (ref 8–23)
CO2: 17 mmol/L — ABNORMAL LOW (ref 22–32)
Calcium: 8.6 mg/dL — ABNORMAL LOW (ref 8.9–10.3)
Chloride: 106 mmol/L (ref 98–111)
Creatinine, Ser: 2.11 mg/dL — ABNORMAL HIGH (ref 0.61–1.24)
GFR calc Af Amer: 30 mL/min — ABNORMAL LOW (ref >60)
GFR calc non Af Amer: 25 mL/min — ABNORMAL LOW (ref >60)
Glucose, Bld: 231 mg/dL — ABNORMAL HIGH (ref 70–99)
Potassium: 4.7 mmol/L (ref 3.5–5.1)
Sodium: 134 mmol/L — ABNORMAL LOW (ref 135–145)
Total Bilirubin: 0.8 mg/dL (ref 0.3–1.2)
Total Protein: 7.2 g/dL (ref 6.5–8.1)

## 2019-08-08 LAB — CBC
HCT: 41.7 % (ref 39.0–52.0)
Hemoglobin: 14.1 g/dL (ref 13.0–17.0)
MCH: 30.7 pg (ref 26.0–34.0)
MCHC: 33.8 g/dL (ref 30.0–36.0)
MCV: 90.8 fL (ref 80.0–100.0)
Platelets: 150 10*3/uL (ref 150–400)
RBC: 4.59 MIL/uL (ref 4.22–5.81)
RDW: 13.5 % (ref 11.5–15.5)
WBC: 12.4 10*3/uL — ABNORMAL HIGH (ref 4.0–10.5)
nRBC: 0 % (ref 0.0–0.2)

## 2019-08-08 LAB — TROPONIN I (HIGH SENSITIVITY)
Troponin I (High Sensitivity): 248 ng/L (ref <18)
Troponin I (High Sensitivity): 408 ng/L (ref <18)
Troponin I (High Sensitivity): 659 ng/L (ref <18)
Troponin I (High Sensitivity): 936 ng/L (ref <18)

## 2019-08-08 LAB — MAGNESIUM: Magnesium: 2.1 mg/dL (ref 1.7–2.4)

## 2019-08-08 MED ORDER — IPRATROPIUM-ALBUTEROL 0.5-2.5 (3) MG/3ML IN SOLN: 3.0000 mL | RESPIRATORY_TRACT | Status: DC | PRN

## 2019-08-08 MED ORDER — ENOXAPARIN SODIUM 40 MG/0.4ML ~~LOC~~ SOLN: 40.0000 mg | SUBCUTANEOUS | Status: DC

## 2019-08-08 MED ORDER — LEVOTHYROXINE SODIUM 50 MCG PO TABS
50.0000 ug | ORAL_TABLET | Freq: Every day | ORAL | Status: DC
  Administered 2019-08-09 – 2019-08-11 (×3): 50 ug via ORAL
  Filled 2019-08-08 (×3): qty 1

## 2019-08-08 MED ORDER — ENOXAPARIN SODIUM 30 MG/0.3ML ~~LOC~~ SOLN
30.0000 mg | SUBCUTANEOUS | Status: DC
  Administered 2019-08-08 – 2019-08-10 (×3): 30 mg via SUBCUTANEOUS
  Filled 2019-08-08 (×4): qty 0.3

## 2019-08-08 MED ORDER — SODIUM CHLORIDE 0.9 % IV SOLN: INTRAVENOUS | Status: DC

## 2019-08-08 NOTE — ED Notes (Signed)
Pt not in room at this time

## 2019-08-08 NOTE — Progress Notes (Signed)
PROGRESS NOTE    Isaac Dixon  POE:[id number] DOB: 10-Apr-1920 DOA: 08/07/2019 PCP: Venia Carbon, MD   Assessment & Plan:   Principal Problem:   Sepsis [facility]) Active Problems:   Rheumatoid arthritis ([place])   Hypothyroidism   Peripheral neuropathy   Chronic kidney disease, stage III (moderate)   Syncope   Seizure ([place])   Aspiration pneumonia ([place])   Sepsis: secondary to aspiration pneumonia: continue on IV zosyn. Blood cxs NGTD. Continue on IVFs.  Aspiration pneumonia: continue IV zosyn & bronchodilators. Encourage incentive spirometry   Syncope vs seizure: observed to have seizure in the ER.  Etiology unclear. S/p IV keppra x 1. MRI brain shows possible subacute ischemia. EEG ordered   Rheumatoid arthritis: hold home dose of etanercept   Hypothyroidism: continue on home dose of levothyroxine   CKDIIIb: baseline Cr is unknown. Continue on IVFs. Will continue to monitor   Elevated troponin: No chest pain. Likely due to demand ischemia from sepsis. Echo ordered.   DVT prophylaxis: lovenox Code Status: DNR Family Communication: called pt's significant other, Robin, but no answer & I was unable to leave a voicemail  Disposition Plan: depends on PT/OT recs   Consultants:      Procedures:    Antimicrobials:    Subjective: Pt c/o shortness of breath   Objective: Vitals:   08/08/19 0530 08/08/19 0600 08/08/19 0630 08/08/19 0645  BP:      Pulse: (!) 105 (!) 102 (!) 104 (!) 102  Resp: (!) 21 19  (!) 22  Temp:      TempSrc:      SpO2: 92% 95% 93% 96%  Weight:      Height:        Intake/Output Summary (Last 24 hours) at 08/08/2019 0829 Last data filed at 08/07/2019 2153 Gross per 24 hour  Intake 500 ml  Output --  Net 500 ml   Filed Weights   08/07/19 1901  Weight: 63.5 kg    Examination:  General exam: Appears calm and comfortable  Respiratory system: diminished breath sounds  Cardiovascular system: S1 & S2 +. No rubs, gallops or  clicks. Gastrointestinal system: Abdomen is nondistended, soft and nontender. Normal bowel sounds heard. Central nervous system: Alert and oriented. Moves all 4 extremities  Psychiatry: Judgement and insight appear normal. Mood & affect appropriate.     Data Reviewed: I have personally reviewed following labs and imaging studies  CBC: Recent Labs  Lab 08/07/19 1858  WBC 12.7*  NEUTROABS 8.6*  HGB 14.6  HCT 43.8  MCV 93.0  PLT 315   Basic Metabolic Panel: Recent Labs  Lab 08/07/19 1858 08/07/19 2345 08/08/19 0455  NA 137  --  134*  K 3.9  --  4.7  CL 104  --  106  CO2 21*  --  17*  GLUCOSE 170*  --  231*  BUN 30*  --  34*  CREATININE 1.92*  --  2.11*  CALCIUM 8.9  --  8.6*  MG  --  2.1  --    GFR: Estimated Creatinine Clearance: 17.4 mL/min (A) (by C-G formula based on SCr of 2.11 mg/dL (H)). Liver Function Tests: Recent Labs  Lab 08/07/19 1858 08/08/19 0455  AST 23 28  ALT 20 20  ALKPHOS 73 63  BILITOT 0.7 0.8  PROT 7.9 7.2  ALBUMIN 3.7 3.3*   No results for input(s): LIPASE, AMYLASE in the last 168 hours. No results for input(s): AMMONIA in the last 168 hours. Coagulation Profile: No  results for input(s): INR, PROTIME in the last 168 hours. Cardiac Enzymes: No results for input(s): CKTOTAL, CKMB, CKMBINDEX, TROPONINI in the last 168 hours. BNP (last 3 results) No results for input(s): PROBNP in the last 8760 hours. HbA1C: No results for input(s): HGBA1C in the last 72 hours. CBG: No results for input(s): GLUCAP in the last 168 hours. Lipid Profile: No results for input(s): CHOL, HDL, LDLCALC, TRIG, CHOLHDL, LDLDIRECT in the last 72 hours. Thyroid Function Tests: No results for input(s): TSH, T4TOTAL, FREET4, T3FREE, THYROIDAB in the last 72 hours. Anemia Panel: No results for input(s): VITAMINB12, FOLATE, FERRITIN, TIBC, IRON, RETICCTPCT in the last 72 hours. Sepsis Labs: Recent Labs  Lab 08/07/19 1941 08/07/19 2206  LATICACIDVEN 2.7* 2.6*     Recent Results (from the past 240 hour(s))  Blood culture (routine x 2)     Status: None (Preliminary result)   Collection Time: 08/07/19  7:41 PM   Specimen: BLOOD  Result Value Ref Range Status   Specimen Description BLOOD  Final   Special Requests BOTTLES DRAWN AEROBIC AND ANAEROBIC  Final   Culture   Final    NO GROWTH < 12 HOURS Performed at [facility], [address], [place], [place] [address]    Report Status PENDING  Incomplete  Blood culture (routine x 2)     Status: None (Preliminary result)   Collection Time: 08/07/19  7:46 PM   Specimen: BLOOD  Result Value Ref Range Status   Specimen Description BLOOD  Final   Special Requests BOTTLES DRAWN AEROBIC AND ANAEROBIC  Final   Culture   Final    NO GROWTH < 12 HOURS Performed at [facility], [address], [place], [place] [address]    Report Status PENDING  Incomplete  SARS Coronavirus 2 by RT PCR (hospital order, performed in [place] hospital lab) Nasopharyngeal Nasopharyngeal Swab     Status: None   Collection Time: 08/07/19  7:55 PM   Specimen: Nasopharyngeal Swab  Result Value Ref Range Status   SARS Coronavirus 2 NEGATIVE NEGATIVE Final    Comment: (NOTE) SARS-CoV-2 target nucleic acids are NOT DETECTED.  The SARS-CoV-2 RNA is generally detectable in upper and lower respiratory specimens during the acute phase of infection. The lowest concentration of SARS-CoV-2 viral copies this assay can detect is 250 copies / mL. A negative result does not preclude SARS-CoV-2 infection and should not be used as the sole basis for treatment or other patient management decisions.  A negative result may occur with improper specimen collection / handling, submission of specimen other than nasopharyngeal swab, presence of viral mutation(s) within the areas targeted by this assay, and inadequate number of viral copies (<250 copies / mL). A negative result must be combined with  clinical observations, patient history, and epidemiological information.  Fact Sheet for Patients:   StrictlyIdeas.no  Fact Sheet for Healthcare Providers: BankingDealers.co.za  This test is not yet approved or  cleared by the [place] FDA and has been authorized for detection and/or diagnosis of SARS-CoV-2 by FDA under an Emergency Use Authorization (EUA).  This EUA will remain in effect (meaning this test can be used) for the duration of the COVID-19 declaration under Section 564(b)(1) of the Act, 21 U.S.C. section 360bbb-3(b)(1), unless the authorization is terminated or revoked sooner.  Performed at [facility], [address], [place], [place] [address]          Radiology Studies: CT Head Wo Contrast  Result Date: 08/07/2019  CLINICAL DATA:  Possible seizure and subsequent fall. EXAM: CT HEAD WITHOUT CONTRAST TECHNIQUE: Contiguous axial images were obtained from the base of the skull through the vertex without intravenous contrast. COMPARISON:  None. FINDINGS: Brain: There is moderate severity cerebral atrophy with widening of the extra-axial spaces and ventricular dilatation. There are areas of decreased attenuation within the white matter tracts of the supratentorial brain, consistent with microvascular disease changes. A small area of cortical encephalomalacia, with adjacent chronic white matter low attenuation, is seen within the cerebellum on the right. Vascular: No hyperdense vessel or unexpected calcification. Skull: Normal. Negative for fracture or focal lesion. Sinuses/Orbits: No acute finding. Other: None. IMPRESSION: 1. Generalized cerebral atrophy. 2. No acute intracranial abnormality. Electronically Signed   By: Virgina Norfolk M.D.   On: 08/07/2019 20:36   CT Cervical Spine Wo Contrast  Result Date: 08/07/2019 CLINICAL DATA:  Possible seizure with subsequent fall. EXAM: CT CERVICAL SPINE WITHOUT  CONTRAST TECHNIQUE: Multidetector CT imaging of the cervical spine was performed without intravenous contrast. Multiplanar CT image reconstructions were also generated. COMPARISON:  None. FINDINGS: Alignment: Normal. Skull base and vertebrae: No acute fracture. No primary bone lesion or focal pathologic process. Soft tissues and spinal canal: No prevertebral fluid or swelling. No visible canal hematoma. Disc levels: Moderate to marked severity endplate sclerosis is seen at the levels of C4-C5, C5-C6 and C6-C7. Marked severity intervertebral disc space narrowing is also seen at these levels. Moderate severity bilateral multilevel facet joint hypertrophy is seen. Upper chest: Negative. Other: None. IMPRESSION: 1. No acute osseous abnormality. 2. Marked severity multilevel degenerative disc disease and facet joint hypertrophy. Electronically Signed   By: Virgina Norfolk M.D.   On: 08/07/2019 20:39   MR BRAIN WO CONTRAST  Result Date: 08/08/2019 CLINICAL DATA:  Initial evaluation for acute encephalopathy. EXAM: MRI HEAD WITHOUT CONTRAST TECHNIQUE: Multiplanar, multiecho pulse sequences of the brain and surrounding structures were obtained without intravenous contrast. COMPARISON:  Prior head CT from 08/07/2019. FINDINGS: Brain: Diffuse prominence of the CSF containing spaces compatible with generalized age-related cerebral atrophy. Patchy and confluent T2/FLAIR hyperintensity within the periventricular deep white matter both cerebral hemispheres most consistent with chronic small vessel ischemic disease. Small remote right cerebellar infarct noted. Few additional scattered chronic lacunar infarcts noted involving the pons. 9 mm focus of mild diffusion abnormality seen involving the subcortical and deep white matter of the posterior left frontal centrum semi ovale (series 6, image 35). No associated ADC correlate. Finding likely reflects the sequelae of late subacute small vessel ischemia. No other diffusion  abnormality to suggest acute or subacute ischemia. Gray-white matter differentiation otherwise maintained. No encephalomalacia to suggest chronic cortical infarction elsewhere within the brain. No acute intracranial hemorrhage. Single chronic microhemorrhage noted at the posterior left parietal region, of doubtful significance in isolation. No mass lesion, midline shift or mass effect. Diffuse ventricular prominence related to global parenchymal volume loss without hydrocephalus. No extra-axial fluid collection. Pituitary gland and suprasellar region within normal limits. Midline structures intact. Vascular: Major intracranial vascular flow voids are well maintained. Skull and upper cervical spine: Craniocervical junction within normal limits. Bone marrow signal intensity normal. No scalp soft tissue abnormality. Sinuses/Orbits: Patient status post bilateral ocular lens replacement. Globes and orbital soft tissues demonstrate no acute finding. Paranasal sinuses are largely clear. Small left mastoid effusion noted. Right mastoid air cells clear. Inner ear structures grossly normal. Other: None. IMPRESSION: 1. 9 mm focus of mild diffusion abnormality involving the posterior left cerebral white matter, likely reflecting  changes related to late subacute small vessel ischemia. This is not felt to be acute, and is likely incidental in nature. 2. No other acute intracranial abnormality. 3. Generalized age-related cerebral atrophy with chronic small vessel ischemic disease. Superimposed small right cerebellar infarct with additional lacunar infarcts at the pons. Electronically Signed   By: Jeannine Boga M.D.   On: 08/08/2019 05:29   DG Chest Portable 1 View  Result Date: 08/07/2019 CLINICAL DATA:  Shortness of breath, syncope EXAM: PORTABLE CHEST 1 VIEW COMPARISON:  February 26, 2018 FINDINGS: There is mild cardiomegaly. Aortic knob calcifications. Prominence of the central pulmonary vasculature with mildly  increased interstitial markings seen throughout both lungs. There are chronic bronchial wall thickening and interstitial markings at both lung bases. No pleural effusion. No acute osseous abnormality. IMPRESSION: Diffusely increased interstitial markings which could be due to interstitial edema and/or infectious etiology. Electronically Signed   By: Prudencio Pair M.D.   On: 08/07/2019 19:28        Scheduled Meds: . enoxaparin (LOVENOX) injection  30 mg Subcutaneous Q24H   Continuous Infusions: . sodium chloride 50 mL/hr at 08/08/19 0504  . piperacillin-tazobactam (ZOSYN)  IV       LOS: 1 day    Time spent: 33 mins     Wyvonnia Dusky, MD [facility] Pager [phone]  If 7PM-7AM, please contact night-coverage [email or web address] 08/08/2019, 8:29 AM

## 2019-08-09 LAB — CBC
HCT: 40.5 % (ref 39.0–52.0)
Hemoglobin: 13.5 g/dL (ref 13.0–17.0)
MCH: 30.8 pg (ref 26.0–34.0)
MCHC: 33.3 g/dL (ref 30.0–36.0)
MCV: 92.3 fL (ref 80.0–100.0)
Platelets: 140 10*3/uL — ABNORMAL LOW (ref 150–400)
RBC: 4.39 MIL/uL (ref 4.22–5.81)
RDW: 13.7 % (ref 11.5–15.5)
WBC: 15.7 10*3/uL — ABNORMAL HIGH (ref 4.0–10.5)
nRBC: 0 % (ref 0.0–0.2)

## 2019-08-09 LAB — BASIC METABOLIC PANEL
Anion gap: 7 (ref 5–15)
BUN: 45 mg/dL — ABNORMAL HIGH (ref 8–23)
CO2: 21 mmol/L — ABNORMAL LOW (ref 22–32)
Calcium: 8.7 mg/dL — ABNORMAL LOW (ref 8.9–10.3)
Chloride: 108 mmol/L (ref 98–111)
Creatinine, Ser: 2.08 mg/dL — ABNORMAL HIGH (ref 0.61–1.24)
GFR calc Af Amer: 30 mL/min — ABNORMAL LOW (ref >60)
GFR calc non Af Amer: 26 mL/min — ABNORMAL LOW (ref >60)
Glucose, Bld: 105 mg/dL — ABNORMAL HIGH (ref 70–99)
Potassium: 4.4 mmol/L (ref 3.5–5.1)
Sodium: 136 mmol/L (ref 135–145)

## 2019-08-09 NOTE — Progress Notes (Signed)
PROGRESS NOTE    Isaac Dixon  VOJ:[id number] DOB: 1921-02-01 DOA: 08/07/2019 PCP: Venia Carbon, MD   Assessment & Plan:   Principal Problem:   Sepsis [facility]) Active Problems:   Rheumatoid arthritis ([place])   Hypothyroidism   Peripheral neuropathy   Chronic kidney disease, stage III (moderate)   Syncope   Seizure ([place])   Aspiration pneumonia ([place])   Sepsis: secondary to aspiration pneumonia: continue on IV zosyn. Blood cxs NGTD. Continue on IVFs. Resolved  Aspiration pneumonia: improving slowly. Continue IV zosyn & bronchodilators. Encourage incentive spirometry   Acute hypoxic respiratory failure: likely secondary to pneumonia. Continue on IV abxs. Continue on supplemental oxygen and wean as tolerated.  Syncope vs seizure: observed to have seizure in the ER.  Etiology unclear. S/p IV keppra x 1. MRI brain shows possible subacute ischemia. EEG ordered   Rheumatoid arthritis: hold home dose of etanercept   Hypothyroidism: continue on home dose of levothyroxine   CKDIIIb: baseline Cr is unknown. Continue on IVFs. Will continue to monitor   Elevated troponin: No chest pain. Likely due to demand ischemia from sepsis. Echo ordered.   Thromboctyopenia: etiology unclear. Will continue to monitor   DVT prophylaxis: lovenox Code Status: DNR Family Communication: Disposition Plan: depends on PT/OT recs  Status is: Inpatient  Remains inpatient appropriate because:IV treatments appropriate due to intensity of illness or inability to take PO   Dispo: The patient is from: Home              Anticipated d/c is to: Home health vs SNF              Anticipated d/c date is: 2 days              Patient currently is not medically stable to d/c.        Consultants:      Procedures:    Antimicrobials:    Subjective: Pt c/o shortness of breath but improved from day prior.   Objective: Vitals:   08/08/19 2033 08/09/19 0434 08/09/19 0736 08/09/19 0801  BP: 109/69  103/63 112/66 112/84  Pulse: 78 80 70 75  Resp:   18 18  Temp: 98.6 F (37 C) 97.6 F (36.4 C) 98.2 F (36.8 C) 98.4 F (36.9 C)  TempSrc: Oral Oral    SpO2: 95% 99% 99% 98%  Weight:  68.4 kg    Height:        Intake/Output Summary (Last 24 hours) at 08/09/2019 1207 Last data filed at 08/09/2019 1000 Gross per 24 hour  Intake 2179.94 ml  Output 901 ml  Net 1278.94 ml   Filed Weights   08/07/19 1901 08/09/19 0434  Weight: 63.5 kg 68.4 kg    Examination:  General exam: Appears calm and comfortable  Respiratory system: decreased breath sounds. No rales Cardiovascular system: S1 & S2 +. No rubs, gallops or clicks. Gastrointestinal system: Abdomen is nondistended, soft and nontender. Normal bowel sounds heard. Central nervous system: Alert and oriented. Moves all 4 extremities  Psychiatry: Judgement and insight appear normal. Mood & affect appropriate.     Data Reviewed: I have personally reviewed following labs and imaging studies  CBC: Recent Labs  Lab 08/07/19 1858 08/08/19 1005 08/09/19 0807  WBC 12.7* 12.4* 15.7*  NEUTROABS 8.6*  --   --   HGB 14.6 14.1 13.5  HCT 43.8 41.7 40.5  MCV 93.0 90.8 92.3  PLT 154 150 993*   Basic Metabolic Panel: Recent Labs  Lab 08/07/19 1858  08/07/19 2345 08/08/19 0455 08/09/19 0807  NA 137  --  134* 136  K 3.9  --  4.7 4.4  CL 104  --  106 108  CO2 21*  --  17* 21*  GLUCOSE 170*  --  231* 105*  BUN 30*  --  34* 45*  CREATININE 1.92*  --  2.11* 2.08*  CALCIUM 8.9  --  8.6* 8.7*  MG  --  2.1  --   --    GFR: Estimated Creatinine Clearance: 17.7 mL/min (A) (by C-G formula based on SCr of 2.08 mg/dL (H)). Liver Function Tests: Recent Labs  Lab 08/07/19 1858 08/08/19 0455  AST 23 28  ALT 20 20  ALKPHOS 73 63  BILITOT 0.7 0.8  PROT 7.9 7.2  ALBUMIN 3.7 3.3*   No results for input(s): LIPASE, AMYLASE in the last 168 hours. No results for input(s): AMMONIA in the last 168 hours. Coagulation Profile: No results  for input(s): INR, PROTIME in the last 168 hours. Cardiac Enzymes: No results for input(s): CKTOTAL, CKMB, CKMBINDEX, TROPONINI in the last 168 hours. BNP (last 3 results) No results for input(s): PROBNP in the last 8760 hours. HbA1C: No results for input(s): HGBA1C in the last 72 hours. CBG: No results for input(s): GLUCAP in the last 168 hours. Lipid Profile: No results for input(s): CHOL, HDL, LDLCALC, TRIG, CHOLHDL, LDLDIRECT in the last 72 hours. Thyroid Function Tests: No results for input(s): TSH, T4TOTAL, FREET4, T3FREE, THYROIDAB in the last 72 hours. Anemia Panel: No results for input(s): VITAMINB12, FOLATE, FERRITIN, TIBC, IRON, RETICCTPCT in the last 72 hours. Sepsis Labs: Recent Labs  Lab 08/07/19 1941 08/07/19 2206  LATICACIDVEN 2.7* 2.6*    Recent Results (from the past 240 hour(s))  Blood culture (routine x 2)     Status: None (Preliminary result)   Collection Time: 08/07/19  7:41 PM   Specimen: BLOOD  Result Value Ref Range Status   Specimen Description BLOOD  Final   Special Requests BOTTLES DRAWN AEROBIC AND ANAEROBIC  Final   Culture   Final    NO GROWTH 2 DAYS Performed at [facility], [address], [place], [place] [address]    Report Status PENDING  Incomplete  Blood culture (routine x 2)     Status: None (Preliminary result)   Collection Time: 08/07/19  7:46 PM   Specimen: BLOOD  Result Value Ref Range Status   Specimen Description BLOOD  Final   Special Requests BOTTLES DRAWN AEROBIC AND ANAEROBIC  Final   Culture   Final    NO GROWTH 2 DAYS Performed at [facility], [address], [place], [place] [address]    Report Status PENDING  Incomplete  SARS Coronavirus 2 by RT PCR (hospital order, performed in [facility] hospital lab) Nasopharyngeal Nasopharyngeal Swab     Status: None   Collection Time: 08/07/19  7:55 PM   Specimen: Nasopharyngeal Swab  Result Value Ref Range Status   SARS Coronavirus 2 NEGATIVE NEGATIVE  Final    Comment: (NOTE) SARS-CoV-2 target nucleic acids are NOT DETECTED.  The SARS-CoV-2 RNA is generally detectable in upper and lower respiratory specimens during the acute phase of infection. The lowest concentration of SARS-CoV-2 viral copies this assay can detect is 250 copies / mL. A negative result does not preclude SARS-CoV-2 infection and should not be used as the sole basis for treatment or other patient management decisions.  A negative result may occur with improper specimen collection / handling,  submission of specimen other than nasopharyngeal swab, presence of viral mutation(s) within the areas targeted by this assay, and inadequate number of viral copies (<250 copies / mL). A negative result must be combined with clinical observations, patient history, and epidemiological information.  Fact Sheet for Patients:   StrictlyIdeas.no  Fact Sheet for Healthcare Providers: BankingDealers.co.za  This test is not yet approved or  cleared by the [place] FDA and has been authorized for detection and/or diagnosis of SARS-CoV-2 by FDA under an Emergency Use Authorization (EUA).  This EUA will remain in effect (meaning this test can be used) for the duration of the COVID-19 declaration under Section 564(b)(1) of the Act, 21 U.S.C. section 360bbb-3(b)(1), unless the authorization is terminated or revoked sooner.  Performed at [facility], [address], [place], [place] [address]          Radiology Studies: CT Head Wo Contrast  Result Date: 08/07/2019 CLINICAL DATA:  Possible seizure and subsequent fall. EXAM: CT HEAD WITHOUT CONTRAST TECHNIQUE: Contiguous axial images were obtained from the base of the skull through the vertex without intravenous contrast. COMPARISON:  None. FINDINGS: Brain: There is moderate severity cerebral atrophy with widening of the extra-axial spaces and ventricular dilatation. There  are areas of decreased attenuation within the white matter tracts of the supratentorial brain, consistent with microvascular disease changes. A small area of cortical encephalomalacia, with adjacent chronic white matter low attenuation, is seen within the cerebellum on the right. Vascular: No hyperdense vessel or unexpected calcification. Skull: Normal. Negative for fracture or focal lesion. Sinuses/Orbits: No acute finding. Other: None. IMPRESSION: 1. Generalized cerebral atrophy. 2. No acute intracranial abnormality. Electronically Signed   By: Virgina Norfolk M.D.   On: 08/07/2019 20:36   CT Cervical Spine Wo Contrast  Result Date: 08/07/2019 CLINICAL DATA:  Possible seizure with subsequent fall. EXAM: CT CERVICAL SPINE WITHOUT CONTRAST TECHNIQUE: Multidetector CT imaging of the cervical spine was performed without intravenous contrast. Multiplanar CT image reconstructions were also generated. COMPARISON:  None. FINDINGS: Alignment: Normal. Skull base and vertebrae: No acute fracture. No primary bone lesion or focal pathologic process. Soft tissues and spinal canal: No prevertebral fluid or swelling. No visible canal hematoma. Disc levels: Moderate to marked severity endplate sclerosis is seen at the levels of C4-C5, C5-C6 and C6-C7. Marked severity intervertebral disc space narrowing is also seen at these levels. Moderate severity bilateral multilevel facet joint hypertrophy is seen. Upper chest: Negative. Other: None. IMPRESSION: 1. No acute osseous abnormality. 2. Marked severity multilevel degenerative disc disease and facet joint hypertrophy. Electronically Signed   By: Virgina Norfolk M.D.   On: 08/07/2019 20:39   MR BRAIN WO CONTRAST  Result Date: 08/08/2019 CLINICAL DATA:  Initial evaluation for acute encephalopathy. EXAM: MRI HEAD WITHOUT CONTRAST TECHNIQUE: Multiplanar, multiecho pulse sequences of the brain and surrounding structures were obtained without intravenous contrast. COMPARISON:   Prior head CT from 08/07/2019. FINDINGS: Brain: Diffuse prominence of the CSF containing spaces compatible with generalized age-related cerebral atrophy. Patchy and confluent T2/FLAIR hyperintensity within the periventricular deep white matter both cerebral hemispheres most consistent with chronic small vessel ischemic disease. Small remote right cerebellar infarct noted. Few additional scattered chronic lacunar infarcts noted involving the pons. 9 mm focus of mild diffusion abnormality seen involving the subcortical and deep white matter of the posterior left frontal centrum semi ovale (series 6, image 35). No associated ADC correlate. Finding likely reflects the sequelae of late subacute small vessel ischemia. No other diffusion abnormality to  suggest acute or subacute ischemia. Gray-white matter differentiation otherwise maintained. No encephalomalacia to suggest chronic cortical infarction elsewhere within the brain. No acute intracranial hemorrhage. Single chronic microhemorrhage noted at the posterior left parietal region, of doubtful significance in isolation. No mass lesion, midline shift or mass effect. Diffuse ventricular prominence related to global parenchymal volume loss without hydrocephalus. No extra-axial fluid collection. Pituitary gland and suprasellar region within normal limits. Midline structures intact. Vascular: Major intracranial vascular flow voids are well maintained. Skull and upper cervical spine: Craniocervical junction within normal limits. Bone marrow signal intensity normal. No scalp soft tissue abnormality. Sinuses/Orbits: Patient status post bilateral ocular lens replacement. Globes and orbital soft tissues demonstrate no acute finding. Paranasal sinuses are largely clear. Small left mastoid effusion noted. Right mastoid air cells clear. Inner ear structures grossly normal. Other: None. IMPRESSION: 1. 9 mm focus of mild diffusion abnormality involving the posterior left cerebral  white matter, likely reflecting changes related to late subacute small vessel ischemia. This is not felt to be acute, and is likely incidental in nature. 2. No other acute intracranial abnormality. 3. Generalized age-related cerebral atrophy with chronic small vessel ischemic disease. Superimposed small right cerebellar infarct with additional lacunar infarcts at the pons. Electronically Signed   By: Jeannine Boga M.D.   On: 08/08/2019 05:29   DG Chest Portable 1 View  Result Date: 08/07/2019 CLINICAL DATA:  Shortness of breath, syncope EXAM: PORTABLE CHEST 1 VIEW COMPARISON:  February 26, 2018 FINDINGS: There is mild cardiomegaly. Aortic knob calcifications. Prominence of the central pulmonary vasculature with mildly increased interstitial markings seen throughout both lungs. There are chronic bronchial wall thickening and interstitial markings at both lung bases. No pleural effusion. No acute osseous abnormality. IMPRESSION: Diffusely increased interstitial markings which could be due to interstitial edema and/or infectious etiology. Electronically Signed   By: Prudencio Pair M.D.   On: 08/07/2019 19:28        Scheduled Meds: . enoxaparin (LOVENOX) injection  30 mg Subcutaneous Q24H  . levothyroxine  50 mcg Oral Q0600   Continuous Infusions: . sodium chloride 50 mL/hr at 08/08/19 0504  . piperacillin-tazobactam (ZOSYN)  IV 3.375 g (08/09/19 0905)     LOS: 2 days    Time spent: 31 mins     Wyvonnia Dusky, MD [facility] Pager [phone]  If 7PM-7AM, please contact night-coverage [email or web address] 08/09/2019, 12:07 PM

## 2019-08-10 ENCOUNTER — Inpatient Hospital Stay (HOSPITAL_COMMUNITY)
Admit: 2019-08-10 | Discharge: 2019-08-10 | Disposition: A | Discharge status: Home / Self Care | Payer: Medicare Other | Attending: Internal Medicine | Admitting: Internal Medicine

## 2019-08-10 DIAGNOSIS — R569 Unspecified convulsions: Secondary | ICD-10-CM

## 2019-08-10 DIAGNOSIS — R55 Syncope and collapse: Secondary | ICD-10-CM

## 2019-08-10 DIAGNOSIS — J9601 Acute respiratory failure with hypoxia: Secondary | ICD-10-CM

## 2019-08-10 LAB — CBC
HCT: 41.3 % (ref 39.0–52.0)
Hemoglobin: 14.3 g/dL (ref 13.0–17.0)
MCH: 30.7 pg (ref 26.0–34.0)
MCHC: 34.6 g/dL (ref 30.0–36.0)
MCV: 88.6 fL (ref 80.0–100.0)
Platelets: 147 10*3/uL — ABNORMAL LOW (ref 150–400)
RBC: 4.66 MIL/uL (ref 4.22–5.81)
RDW: 13.6 % (ref 11.5–15.5)
WBC: 10.8 10*3/uL — ABNORMAL HIGH (ref 4.0–10.5)
nRBC: 0 % (ref 0.0–0.2)

## 2019-08-10 LAB — BASIC METABOLIC PANEL
Anion gap: 5 (ref 5–15)
BUN: 39 mg/dL — ABNORMAL HIGH (ref 8–23)
CO2: 23 mmol/L (ref 22–32)
Calcium: 8.5 mg/dL — ABNORMAL LOW (ref 8.9–10.3)
Chloride: 110 mmol/L (ref 98–111)
Creatinine, Ser: 1.81 mg/dL — ABNORMAL HIGH (ref 0.61–1.24)
GFR calc Af Amer: 36 mL/min — ABNORMAL LOW (ref >60)
GFR calc non Af Amer: 31 mL/min — ABNORMAL LOW (ref >60)
Glucose, Bld: 97 mg/dL (ref 70–99)
Potassium: 4.3 mmol/L (ref 3.5–5.1)
Sodium: 138 mmol/L (ref 135–145)

## 2019-08-10 LAB — ECHOCARDIOGRAM COMPLETE
Height: 65 in
Weight: 2372.8 oz

## 2019-08-10 MED ORDER — SODIUM CHLORIDE 0.9 % IV SOLN: INTRAVENOUS | Status: DC | PRN

## 2019-08-10 NOTE — Evaluation (Signed)
Physical Therapy Evaluation Patient Details Name: Isaac Dixon MRN: [id number] DOB: 1920-09-28 Today's Date: 08/10/2019   History of Present Illness  Patient presented to hospital for syncopal episode at home where he fell and hit the back of his head. Was found to be hypoxic requiring 3 L of oxygen and in ER was noted to have another episode of AMS with tonic clonic seizures with frothing, loss of bladder. Patient has PMH of hypothyroidism, RA, TIA's, peripheral neuropathy, CKD stage III.  Clinical Impression  Patient is a very pleasant 86 year old male who presents with generalized weakness and limited stability secondary to admission for sepsis. Patient lives at [facility] with his significant other and utilizes a power wheelchair as his primary mode of mobility. Per patient he transfers ind from bed to/from wheelchair as well as stands for toileting/showering but is no longer ambulating. Patient is on 1L of oxygen via nasal cannula upon PT entering room and is eager to participate in therapy. Performs bed mobility with Mod I and extra time with HOB elevated while maintaining Sp02>90%. Upon seated he is limited in stability requiring a single hand to support as well as bilateral feet on ground. Is able to assist in doffing personal socks and donning hospital socks. STS performed with CGA and SPT additionally with CGA and good safety awareness with side stepping to reach position. Is made comfortable in recliner with all needs met and alarm set. Patient will benefit from skilled physical therapy while in the hospital to increase strength, stability in seated position, and safety with transfers. Upon discharge patient will benefit from HHPT and intermittent aide.     Follow Up Recommendations Home health PT;Supervision - Intermittent    Equipment Recommendations  3in1 (PT)    Recommendations for Other Services       Precautions / Restrictions Precautions Precautions: Fall Restrictions Weight  Bearing Restrictions: No      Mobility  Bed Mobility Overal bed mobility: Modified Independent             General bed mobility comments: requires additional time and HOB elevated for sequencing.  Transfers Overall transfer level: Needs assistance Equipment used: Rolling walker (2 wheeled) Transfers: Sit to/from [facility] Sit to Stand: Min guard Stand pivot transfers: Min guard       General transfer comment: CGA with STS and SPT from bed to chair , good safety awareness and side step strategy  Ambulation/Gait             General Gait Details: does not ambulate much at baseline per patient report.  Stairs            Wheelchair Mobility    Modified Rankin (Stroke Patients Only)       Balance Overall balance assessment: Needs assistance Sitting-balance support: Single extremity supported;Feet supported Sitting balance-Leahy Scale: Fair Sitting balance - Comments: able to move LE's in seated position but is challenged requiring UE support Postural control: Posterior lean Standing balance support: Bilateral upper extremity supported;During functional activity Standing balance-Leahy Scale: Poor Standing balance comment: requires heavy use of UE's on RW                             Pertinent Vitals/Pain Pain Assessment: No/denies pain    Home Living Family/patient expects to be discharged to:: Assisted living               Home Equipment: Walker - 2 wheels;Shower seat;Wheelchair -  power;Hand held shower head;Grab bars - tub/shower Additional Comments: Patient utilizes a power chair for majority of mobility. Transfers by himself to/from power chair.    Prior Function Level of Independence: Needs assistance   Gait / Transfers Assistance Needed: uses power chair for primary use of mobility, STS transfers without an AD, sometimes uses a walker. STS transfer for toileting, transferring to/from bed to chair and  shower  ADL's / Homemaking Assistance Needed: has homecare aide come in 3 days/week to assist in showering and other ADLs.  Comments: Patient is a very engaged active man who lives with his significant other at [place]        Extremity/Trunk Assessment   Upper Extremity Assessment Upper Extremity Assessment: Overall WFL for tasks assessed    Lower Extremity Assessment Lower Extremity Assessment: Generalized weakness;RLE deficits/detail;LLE deficits/detail RLE Deficits / Details: grossly 4-/5 RLE Sensation: decreased light touch RLE Coordination: decreased gross motor LLE Deficits / Details: grossly 3+/5 LLE Sensation: decreased light touch LLE Coordination: decreased gross motor       Communication   Communication: HOH  Cognition Arousal/Alertness: Awake/alert Behavior During Therapy: WFL for tasks assessed/performed Overall Cognitive Status: Within Functional Limits for tasks assessed                                 General Comments: Eager to participate in physical therapy session.      General Comments General comments (skin integrity, edema, etc.): patient appears to be well groomed and have decent muscle tone for age    Exercises Other Exercises Other Exercises: Patient educated on role of PT in acute care setting, bed mobility and transfers with focus on safety and reduced fall risk, and breathing with mobility for retaining Sp02>90 %.   Assessment/Plan    PT Assessment Patient needs continued PT services  PT Problem List Decreased strength;Decreased activity tolerance;Decreased balance;Decreased mobility;Cardiopulmonary status limiting activity;Impaired sensation       PT Treatment Interventions DME instruction;Functional mobility training;Therapeutic activities;Patient/family education;Neuromuscular re-education;Balance training;Therapeutic exercise;Wheelchair mobility training    PT Goals (Current goals can be found in  the Care Plan section)  Acute Rehab PT Goals Patient Stated Goal: to return home PT Goal Formulation: With patient Time For Goal Achievement: 08/24/19 Potential to Achieve Goals: Fair    Frequency Min 2X/week   Barriers to discharge   would benefit from HHPT and aide    Co-evaluation               AM-PAC PT "6 Clicks" Mobility  Outcome Measure Help needed turning from your back to your side while in a flat bed without using bedrails?: None Help needed moving from lying on your back to sitting on the side of a flat bed without using bedrails?: A Little Help needed moving to and from a bed to a chair (including a wheelchair)?: A Little Help needed standing up from a chair using your arms (e.g., wheelchair or bedside chair)?: A Little Help needed to walk in hospital room?: A Lot Help needed climbing 3-5 steps with a railing? : Total 6 Click Score: 16    End of Session Equipment Utilized During Treatment: Gait belt;Oxygen (1 L 02 via nasal cannula) Activity Tolerance: Patient tolerated treatment well Patient left: in chair;with call bell/phone within reach;with chair alarm set Nurse Communication: Mobility status;Other (comment) (Sp02>90% throughout entire session) PT Visit Diagnosis: Unsteadiness on feet (R26.81);Other abnormalities of  gait and mobility (R26.89);Muscle weakness (generalized) (M62.81)    Time: 3567-0141 PT Time Calculation (min) (ACUTE ONLY): 26 min   Charges:   PT Evaluation $PT Eval Moderate Complexity: 1 Mod PT Treatments $Therapeutic Activity: 8-22 mins       Janna Arch, PT, DPT   08/10/2019, 11:03 AM

## 2019-08-10 NOTE — Evaluation (Signed)
Occupational Therapy Evaluation Patient Details Name: Isaac Dixon MRN: [id number] DOB: 03/31/1920 Today's Date: 08/10/2019    History of Present Illness Patient presented to hospital for syncopal episode at home where he fell and hit the back of his head. Was found to be hypoxic requiring 3 L of oxygen and in ER was noted to have another episode of AMS with tonic clonic seizures with frothing, loss of bladder. Patient has PMH of hypothyroidism, RA, TIA's, peripheral neuropathy, CKD stage III.   Clinical Impression   Mr "Ed" Duggar presents sitting EOB with a visitor present, ready to stand up and move to recliner. Pt on room air and 91-94% O2 sats t/o. He is confident in his physical abilities and demonstrates excellent strength for his age. He completed sit to stand with Min A and shuffled during stand pivot t/f with CGA to sit in recliner. Heavy reliance on bedrail and HHA to maintain balance. Pt lives in a [place] at [facility] and lives an active lifestyle, reporting 2 falls in the past year. He began using a power chair for most mobility last year, but transfers independently from his w/c to the bed and shower chair during ADL. Prior to hospitalization, pt was Mod I in dressing, Min A for bathing (drying off) and toileted independently. He has a home health aid to supervise and assist with ADL 3x/week. Pt educated re: role of OT, and seated exercises to maintain strength, per pt request. Pt will benefit from skilled acute OT services to address impairments in balance, energy conservation strategies and safe use of DME/AE to maximize pt independence and reduce falls risk. Recommend HHOT upon discharge.     Follow Up Recommendations  Home health OT    Equipment Recommendations  3 in 1 bedside commode    Recommendations for Other Services       Precautions / Restrictions Precautions Precautions: Fall Restrictions Weight Bearing Restrictions: No Other Position/Activity Restrictions:  monitor O2      Mobility Bed Mobility Overal bed mobility: Modified Independent             General bed mobility comments: deferred this session  Transfers Overall transfer level: Needs assistance     Sit to Stand: Min guard Stand pivot transfers: Min guard       General transfer comment: CGA with STS and stand pivot t/f from bed to recliner; shuffling side steps and reliance on bedrails/recliner arm rests noted, as well as HHA    Balance Overall balance assessment: Needs assistance Sitting-balance support: Feet supported;No upper extremity supported Sitting balance-Leahy Scale: Fair Sitting balance - Comments: steady static sitting balance at EOB   Standing balance support: Bilateral upper extremity supported Standing balance-Leahy Scale: Poor Standing balance comment: requires heavy use of bedrails and HHA for standing balance during t/f                           ADL either performed or assessed with clinical judgement   ADL Overall ADL's : Needs assistance/impaired                                       General ADL Comments: Pt is close to baseline with ADL (see Prior Function), however he does need additional assist during standing ADL for balance and is having trouble reaching his feet to don/doff socks and shoes.  Vision         Perception     Praxis      Pertinent Vitals/Pain Pain Assessment: No/denies pain     Hand Dominance Right   Extremity/Trunk Assessment Upper Extremity Assessment Upper Extremity Assessment: Overall WFL for tasks assessed;RUE deficits/detail;LUE deficits/detail RUE Deficits / Details: R hand with slight physical deformity (pt reports Repetitive Motion Syndrome)- greater FMC difficulties; great functional strength, able to complete tricep dips in the recliner RUE Sensation: history of peripheral neuropathy;WNL RUE Coordination: decreased fine motor LUE Deficits / Details: great functional  strength, able to complete tricep dips in recliner LUE Sensation: history of peripheral neuropathy;WNL   Lower Extremity Assessment Lower Extremity Assessment: Generalized weakness;Defer to PT evaluation       Communication Communication Communication: HOH   Cognition Arousal/Alertness: Awake/alert Behavior During Therapy: WFL for tasks assessed/performed Overall Cognitive Status: Within Functional Limits for tasks assessed                                 General Comments: eager to participate, confident in physical ability   General Comments       Exercises Other Exercises Other Exercises: Practiced safe transfer in preparation for ADL Other Exercises: Educated pt re: the role of OT in acute care and seated exercises for pt enjoyment and maintaining strength   Shoulder Instructions      Home Living Family/patient expects to be discharged to:: Assisted living                             Home Equipment: Walker - 2 wheels;Shower seat;Wheelchair - power;Hand held shower head;Grab bars - tub/shower   Additional Comments: Patient utilizes a power chair for majority of mobility. Transfers by himself to/from power chair.      Prior Functioning/Environment Level of Independence: Needs assistance  Gait / Transfers Assistance Needed: uses power chair for primary use of mobility, STS transfers without an AD, sometimes uses a walker. STS transfer for toileting, transferring to/from bed to chair and shower ADL's / Homemaking Assistance Needed: has homecare aide come in 3 days/week to assist in showering and other ADLs. Pt bathes with supervision and requires some physical assist for drying off. Endorses recent difficulties with bending over to put socks on. Communication / Swallowing Assistance Needed: HOH, has hearing aids Comments: Patient is a very engaged active man who lives with his significant other at [facility]. He does 10 pushups every morning.         OT Problem List: Decreased activity tolerance;Decreased knowledge of use of DME or AE;Decreased coordination;Cardiopulmonary status limiting activity;Impaired sensation;Impaired balance (sitting and/or standing);Decreased strength      OT Treatment/Interventions: Self-care/ADL training;Therapeutic exercise;Therapeutic activities;Energy conservation;DME and/or AE instruction;Patient/family education;Balance training    OT Goals(Current goals can be found in the care plan section) Acute Rehab OT Goals Patient Stated Goal: To go home soon OT Goal Formulation: With patient Time For Goal Achievement: 08/24/19 Potential to Achieve Goals: Good ADL Goals Pt Will Perform Lower Body Dressing: with modified independence;sit to/from stand;with adaptive equipment (using LRAD) Pt Will Transfer to Toilet: with modified independence;bedside commode;stand pivot transfer;grab bars (using LRAD) Additional ADL Goal #1: Pt will independently verbalize 3 or more falls prevention strategies in the context of ADL with one or fewer prompts in order to maximize safety  OT Frequency: Min 2X/week   Barriers to D/C:  Co-evaluation              AM-PAC OT "6 Clicks" Daily Activity     Outcome Measure Help from another person eating meals?: None Help from another person taking care of personal grooming?: None Help from another person toileting, which includes using toliet, bedpan, or urinal?: A Little Help from another person bathing (including washing, rinsing, drying)?: A Little Help from another person to put on and taking off regular upper body clothing?: None Help from another person to put on and taking off regular lower body clothing?: A Little 6 Click Score: 21   End of Session Equipment Utilized During Treatment: Gait belt Nurse Communication: Mobility status;Other (comment) (cognition)  Activity Tolerance: Patient tolerated treatment well Patient left: in chair;with call bell/phone  within reach;with chair alarm set  OT Visit Diagnosis: Unsteadiness on feet (R26.81);Other abnormalities of gait and mobility (R26.89);History of falling (Z91.81);Muscle weakness (generalized) (M62.81)                Time: 9471-2527 OT Time Calculation (min): 43 min Charges:  OT General Charges $OT Visit: 1 Visit OT Evaluation $OT Eval Moderate Complexity: 1 Mod OT Treatments $Self Care/Home Management : 8-22 mins  Jerilynn Birkenhead, OTS 08/10/19, 3:43 PM

## 2019-08-10 NOTE — Progress Notes (Signed)
eeg completed ° °

## 2019-08-10 NOTE — Procedures (Signed)
ELECTROENCEPHALOGRAM REPORT   Patient: Isaac Dixon       Room #: 244A-AA EEG No. ID: [id number] Age: 86 y.o.        Sex: male Requesting Physician: Jimmye Norman Report Date:  08/10/2019        Interpreting Physician: Alexis Goodell  History: Isaac Dixon is an 86 y.o. male with witnessed seizure  Medications:  Synthroid, Zosyn  Conditions of Recording:  This is a 21 channel routine scalp EEG performed with bipolar and monopolar montages arranged in accordance to the international 10/20 system of electrode placement. One channel was dedicated to EKG recording.  The patient is in the awake, drowsy and asleep states.  Description:  The waking background activity consists of a low voltage, symmetrical, fairly well organized, 7 Hz theta activity, seen from the parieto-occipital and posterior temporal regions.  Low voltage fast activity, poorly organized, is seen anteriorly and is at times superimposed on more posterior regions.  A mixture of theta and alpha rhythms are seen from the central and temporal regions. The patient drowses with slowing to irregular, low voltage theta and beta activity.   The patient goes in to a light sleep with symmetrical sleep spindles, vertex central sharp transients and irregular slow activity.   No epileptiform activity is noted.   Hyperventilation was not performed. Intermittent photic stimulation was performed and elicits a symmetrical driving response but fails to elicit any abnormalities.  IMPRESSION: Normal electroencephalogram, awake, asleep and with activation procedures. There are no focal lateralizing or epileptiform features.   Alexis Goodell, MD Neurology [phone] 08/10/2019, 2:46 PM

## 2019-08-10 NOTE — Progress Notes (Signed)
Pharmacy Antibiotic Note  Viaan Knippenberg is a 86 y.o. male admitted on 08/07/2019 with pneumonia.  Pharmacy has been consulted for Zosyn dosing.  Plan: Patient received a dose of Unasyn 3g IV x 1 in the ED.  Patient has a h/o of CKD currently AKI on CKD w/ Scr of 1.81 (baseline ~ 1.5 - 1.6). Will start patient on zosyn 3.375g IV q12h per CrCl < 20 ml/min, patient is not on dialysis. Will continue to monitor and adjust doses per changes in renal function.   Height: 5\' 5"  (165.1 cm) Weight: 67.3 kg (148 lb 4.8 oz) IBW/kg (Calculated) : 61.5  Temp (24hrs), Avg:98 F (36.7 C), Min:97.7 F (36.5 C), Max:98.3 F (36.8 C)  Recent Labs  Lab 08/07/19 1858 08/07/19 1941 08/07/19 2206 08/08/19 0455 08/08/19 1005 08/09/19 0807 08/10/19 0706  WBC 12.7*  --   --   --  12.4* 15.7* 10.8*  CREATININE 1.92*  --   --  2.11*  --  2.08* 1.81*  LATICACIDVEN  --  2.7* 2.6*  --   --   --   --     Estimated Creatinine Clearance: 20.3 mL/min (A) (by C-G formula based on SCr of 1.81 mg/dL (H)).    No Known Allergies   Thank you for allowing pharmacy to be a part of this patient's care.  Eleonore Chiquito, PharmD, BCPS Clinical Pharmacist 08/10/2019 1:50 PM

## 2019-08-10 NOTE — Care Management Important Message (Signed)
Important Message  Patient Details  Name: Isaac Dixon MRN: [id number] Date of Birth: 24-Apr-1920   Medicare Important Message Given:  Yes     Juliann Pulse A Tylia Ewell 08/10/2019, 11:26 AM

## 2019-08-10 NOTE — Progress Notes (Signed)
PROGRESS NOTE    Isaac Dixon  FXT:[id number] DOB: 1920-11-24 DOA: 08/07/2019 PCP: Venia Carbon, MD   Assessment & Plan:   Principal Problem:   Sepsis [facility]) Active Problems:   Rheumatoid arthritis ([place])   Hypothyroidism   Peripheral neuropathy   Chronic kidney disease, stage III (moderate)   Syncope   Seizure ([place])   Aspiration pneumonia ([place])   Sepsis: secondary to aspiration pneumonia. Continue on IV zosyn. Blood cxs NGTD. Continue on IVFs. Resolved  Aspiration pneumonia: continues to improve. Continue IV zosyn & bronchodilators. Encourage incentive spirometry   Acute hypoxic respiratory failure: likely secondary to pneumonia. Continue on IV abxs. Continue on supplemental oxygen and wean as tolerated.  Syncope vs seizure: observed to have seizure in the ER.  Etiology unclear. S/p IV keppra x 1. MRI brain shows possible subacute ischemia. EEG pending   Rheumatoid arthritis: hold home dose of etanercept   Hypothyroidism: continue on home dose of levothyroxine   CKDIIIb: baseline Cr is unknown. Continue on IVFs. Will continue to monitor   Elevated troponin: No chest pain. Likely due to demand ischemia from sepsis. Echo ordered.   Thromboctyopenia: etiology unclear. Will continue to monitor   DVT prophylaxis: lovenox Code Status: DNR Family Communication: Disposition Plan: likely d/c home w/ health  Status is: Inpatient  Remains inpatient appropriate because:IV treatments appropriate due to intensity of illness or inability to take PO. Waiting on EEG results as well    Dispo: The patient is from: Home              Anticipated d/c is to: Home health               Anticipated d/c date is: 1 day               Patient currently is not medically stable to d/c.     Consultants:      Procedures:    Antimicrobials:    Subjective: Pt c/o shortness of breath.   Objective: Vitals:   08/09/19 0801 08/09/19 1220 08/09/19 2024 08/10/19 0313  BP:  112/84 123/73 (!) 160/86 (!) 154/87  Pulse: 75 68 88 73  Resp: 18 18 20 20   Temp: 98.4 F (36.9 C)  97.7 F (36.5 C) 98 F (36.7 C)  TempSrc:    Oral  SpO2: 98% 99% 97% 98%  Weight:    67.3 kg  Height:        Intake/Output Summary (Last 24 hours) at 08/10/2019 0837 Last data filed at 08/10/2019 0238 Gross per 24 hour  Intake 979.01 ml  Output 1010 ml  Net -30.99 ml   Filed Weights   08/07/19 1901 08/09/19 0434 08/10/19 0313  Weight: 63.5 kg 68.4 kg 67.3 kg    Examination:  General exam: Appears calm and comfortable  Respiratory system: diminished breath sounds. No wheezes Cardiovascular system: S1 & S2 +. No rubs, gallops or clicks. Gastrointestinal system: Abdomen is nondistended, soft and nontender. Normal bowel sounds heard. Central nervous system: Alert and oriented. Moves all 4 extremities  Psychiatry: Judgement and insight appear normal. Mood & affect appropriate.     Data Reviewed: I have personally reviewed following labs and imaging studies  CBC: Recent Labs  Lab 08/07/19 1858 08/08/19 1005 08/09/19 0807 08/10/19 0706  WBC 12.7* 12.4* 15.7* 10.8*  NEUTROABS 8.6*  --   --   --   HGB 14.6 14.1 13.5 14.3  HCT 43.8 41.7 40.5 41.3  MCV 93.0 90.8 92.3 88.6  PLT 154  150 140* 812*   Basic Metabolic Panel: Recent Labs  Lab 08/07/19 1858 08/07/19 2345 08/08/19 0455 08/09/19 0807 08/10/19 0706  NA 137  --  134* 136 138  K 3.9  --  4.7 4.4 4.3  CL 104  --  106 108 110  CO2 21*  --  17* 21* 23  GLUCOSE 170*  --  231* 105* 97  BUN 30*  --  34* 45* 39*  CREATININE 1.92*  --  2.11* 2.08* 1.81*  CALCIUM 8.9  --  8.6* 8.7* 8.5*  MG  --  2.1  --   --   --    GFR: Estimated Creatinine Clearance: 20.3 mL/min (A) (by C-G formula based on SCr of 1.81 mg/dL (H)). Liver Function Tests: Recent Labs  Lab 08/07/19 1858 08/08/19 0455  AST 23 28  ALT 20 20  ALKPHOS 73 63  BILITOT 0.7 0.8  PROT 7.9 7.2  ALBUMIN 3.7 3.3*   No results for input(s): LIPASE,  AMYLASE in the last 168 hours. No results for input(s): AMMONIA in the last 168 hours. Coagulation Profile: No results for input(s): INR, PROTIME in the last 168 hours. Cardiac Enzymes: No results for input(s): CKTOTAL, CKMB, CKMBINDEX, TROPONINI in the last 168 hours. BNP (last 3 results) No results for input(s): PROBNP in the last 8760 hours. HbA1C: No results for input(s): HGBA1C in the last 72 hours. CBG: No results for input(s): GLUCAP in the last 168 hours. Lipid Profile: No results for input(s): CHOL, HDL, LDLCALC, TRIG, CHOLHDL, LDLDIRECT in the last 72 hours. Thyroid Function Tests: No results for input(s): TSH, T4TOTAL, FREET4, T3FREE, THYROIDAB in the last 72 hours. Anemia Panel: No results for input(s): VITAMINB12, FOLATE, FERRITIN, TIBC, IRON, RETICCTPCT in the last 72 hours. Sepsis Labs: Recent Labs  Lab 08/07/19 1941 08/07/19 2206  LATICACIDVEN 2.7* 2.6*    Recent Results (from the past 240 hour(s))  Blood culture (routine x 2)     Status: None (Preliminary result)   Collection Time: 08/07/19  7:41 PM   Specimen: BLOOD  Result Value Ref Range Status   Specimen Description BLOOD  Final   Special Requests BOTTLES DRAWN AEROBIC AND ANAEROBIC  Final   Culture   Final    NO GROWTH 3 DAYS Performed at [facility], [address], [place], [place] [address]    Report Status PENDING  Incomplete  Blood culture (routine x 2)     Status: None (Preliminary result)   Collection Time: 08/07/19  7:46 PM   Specimen: BLOOD  Result Value Ref Range Status   Specimen Description BLOOD  Final   Special Requests BOTTLES DRAWN AEROBIC AND ANAEROBIC  Final   Culture   Final    NO GROWTH 3 DAYS Performed at [facility], [address], [place], [place] [address]    Report Status PENDING  Incomplete  SARS Coronavirus 2 by RT PCR (hospital order, performed in [place] hospital lab) Nasopharyngeal Nasopharyngeal Swab     Status: None   Collection Time:  08/07/19  7:55 PM   Specimen: Nasopharyngeal Swab  Result Value Ref Range Status   SARS Coronavirus 2 NEGATIVE NEGATIVE Final    Comment: (NOTE) SARS-CoV-2 target nucleic acids are NOT DETECTED.  The SARS-CoV-2 RNA is generally detectable in upper and lower respiratory specimens during the acute phase of infection. The lowest concentration of SARS-CoV-2 viral copies this assay can detect is 250 copies / mL. A negative result does not preclude SARS-CoV-2 infection and  should not be used as the sole basis for treatment or other patient management decisions.  A negative result may occur with improper specimen collection / handling, submission of specimen other than nasopharyngeal swab, presence of viral mutation(s) within the areas targeted by this assay, and inadequate number of viral copies (<250 copies / mL). A negative result must be combined with clinical observations, patient history, and epidemiological information.  Fact Sheet for Patients:   StrictlyIdeas.no  Fact Sheet for Healthcare Providers: BankingDealers.co.za  This test is not yet approved or  cleared by the [place] FDA and has been authorized for detection and/or diagnosis of SARS-CoV-2 by FDA under an Emergency Use Authorization (EUA).  This EUA will remain in effect (meaning this test can be used) for the duration of the COVID-19 declaration under Section 564(b)(1) of the Act, 21 U.S.C. section 360bbb-3(b)(1), unless the authorization is terminated or revoked sooner.  Performed at [facility], [address], [place], [place] [address]          Radiology Studies: No results found.      Scheduled Meds: . enoxaparin (LOVENOX) injection  30 mg Subcutaneous Q24H  . levothyroxine  50 mcg Oral Q0600   Continuous Infusions: . sodium chloride 50 mL/hr at 08/10/19 0238  . piperacillin-tazobactam (ZOSYN)  IV Stopped (08/10/19 0052)     LOS:  3 days    Time spent: 30 mins     Wyvonnia Dusky, MD [facility] Pager [phone]  If 7PM-7AM, please contact night-coverage [email or web address] 08/10/2019, 8:37 AM

## 2019-08-10 NOTE — Progress Notes (Signed)
*  PRELIMINARY RESULTS* Echocardiogram 2D Echocardiogram has been performed.  Sherrie Sport 08/10/2019, 7:52 AM

## 2019-08-11 ENCOUNTER — Telehealth: Payer: Self-pay | Admitting: Internal Medicine

## 2019-08-11 DIAGNOSIS — N1832 Chronic kidney disease, stage 3b: Secondary | ICD-10-CM

## 2019-08-11 LAB — CBC
HCT: 41.5 % (ref 39.0–52.0)
Hemoglobin: 14.4 g/dL (ref 13.0–17.0)
MCH: 30.6 pg (ref 26.0–34.0)
MCHC: 34.7 g/dL (ref 30.0–36.0)
MCV: 88.1 fL (ref 80.0–100.0)
Platelets: 148 10*3/uL — ABNORMAL LOW (ref 150–400)
RBC: 4.71 MIL/uL (ref 4.22–5.81)
RDW: 13.3 % (ref 11.5–15.5)
WBC: 9.9 10*3/uL (ref 4.0–10.5)
nRBC: 0 % (ref 0.0–0.2)

## 2019-08-11 LAB — BASIC METABOLIC PANEL
Anion gap: 6 (ref 5–15)
BUN: 33 mg/dL — ABNORMAL HIGH (ref 8–23)
CO2: 22 mmol/L (ref 22–32)
Calcium: 8.4 mg/dL — ABNORMAL LOW (ref 8.9–10.3)
Chloride: 112 mmol/L — ABNORMAL HIGH (ref 98–111)
Creatinine, Ser: 1.83 mg/dL — ABNORMAL HIGH (ref 0.61–1.24)
GFR calc Af Amer: 35 mL/min — ABNORMAL LOW (ref >60)
GFR calc non Af Amer: 30 mL/min — ABNORMAL LOW (ref >60)
Glucose, Bld: 104 mg/dL — ABNORMAL HIGH (ref 70–99)
Potassium: 3.9 mmol/L (ref 3.5–5.1)
Sodium: 140 mmol/L (ref 135–145)

## 2019-08-11 MED ORDER — PIPERACILLIN-TAZOBACTAM 3.375 G IVPB: 3.3750 g | Freq: Three times a day (TID) | INTRAVENOUS | Status: DC

## 2019-08-11 MED ORDER — PREDNISONE 20 MG PO TABS: 40.0000 mg | ORAL_TABLET | Freq: Every day | ORAL | 0 refills | Status: AC

## 2019-08-11 MED ORDER — AMOXICILLIN-POT CLAVULANATE 875-125 MG PO TABS
1.0000 | ORAL_TABLET | Freq: Two times a day (BID) | ORAL | 0 refills | Status: AC
Start: 2019-08-11 — End: 2019-08-15

## 2019-08-11 NOTE — Telephone Encounter (Signed)
Left message for Robin to return my call

## 2019-08-11 NOTE — NC FL2 (Signed)
[place] LEVEL OF CARE SCREENING TOOL     IDENTIFICATION  Patient Name: Isaac Dixon Birthdate: November 19, 1920 Sex: male Admission Date (Current Location): 08/07/2019  [facility] and [place] Number:  Engineering geologist and Address:  [facility], [address] [place], [place] [address]      Provider Number: [id number]  Attending Physician Name and Address:  Wyvonnia Dusky, MD  Relative Name and Phone Number:       Current Level of Care: Hospital Recommended Level of Care: [place] Prior Approval Number:    Date Approved/Denied:   PASRR Number:    Discharge Plan: Other (Comment) (ALF)    Current Diagnoses: Patient Active Problem List   Diagnosis Date Noted  . Syncope 08/07/2019  . Sepsis ([place]) 08/07/2019  . Seizure ([place]) 08/07/2019  . Aspiration pneumonia ([place]) 08/07/2019  . Leg weakness 03/17/2019  . Back pain 02/25/2019  . Pain due to onychomycosis of toenails of both feet 12/15/2018  . BPH with obstruction/lower urinary tract symptoms 12/10/2017  . Chronic kidney disease, stage III (moderate) 12/10/2017  . Preventative health care 07/06/2014  . Peripheral neuropathy 07/06/2014  . Advance directive discussed with patient 07/06/2014  . TIA (transient ischemic attack)   . Rheumatoid arthritis ([place])   . Hypothyroidism     Orientation RESPIRATION BLADDER Height & Weight     Self, Time, Situation, Place  Normal Continent Weight: 145 lb 14.4 oz (66.2 kg) Height:  5\' 5"  (165.1 cm)  BEHAVIORAL SYMPTOMS/MOOD NEUROLOGICAL BOWEL NUTRITION STATUS      Continent Diet (heart healthy, thin liquids)  AMBULATORY STATUS COMMUNICATION OF NEEDS Skin   Limited Assist Verbally Normal                       Personal Care Assistance Level of Assistance  Dressing, Feeding, Bathing Bathing Assistance: Limited assistance Feeding assistance: Independent Dressing Assistance: Limited assistance     Functional  Limitations Info  Sight, Hearing, Speech Sight Info: Adequate Hearing Info: Adequate Speech Info: Adequate    SPECIAL CARE FACTORS FREQUENCY  PT (By licensed PT), OT (By licensed OT)                    Contractures Contractures Info: Not present    Additional Factors Info  Code Status, Allergies Code Status Info: DNR Allergies Info: No known allergies           Current Medications (08/11/2019):  This is the current hospital active medication list Current Facility-Administered Medications  Medication Dose Route Frequency Provider Last Rate Last Admin  . 0.9 %  sodium chloride infusion   Intravenous Continuous Elwyn Reach, MD 50 mL/hr at 08/10/19 2209 New Bag at 08/10/19 2209  . 0.9 %  sodium chloride infusion   Intravenous PRN Wyvonnia Dusky, MD      . enoxaparin (LOVENOX) injection 30 mg  30 mg Subcutaneous Q24H Oswald Hillock, RPH   30 mg at 08/10/19 0940  . ipratropium-albuterol (DUONEB) 0.5-2.5 (3) MG/3ML nebulizer solution 3 mL  3 mL Nebulization Q4H PRN Wyvonnia Dusky, MD      . levothyroxine (SYNTHROID) tablet 50 mcg  50 mcg Oral Q0600 Wyvonnia Dusky, MD   50 mcg at 08/11/19 0527  . piperacillin-tazobactam (ZOSYN) IVPB 3.375 g  3.375 g Intravenous Q8H Rocky Morel, [facility]         Discharge Medications: Please see discharge summary for a list of discharge medications.  Relevant Imaging Results:  Relevant Lab Results:   Additional Information    Ayanah Snader A [facility], LCSW

## 2019-08-11 NOTE — TOC Transition Note (Signed)
Transition of Care [facility]) - CM/SW Discharge Note   Patient Details  Name: Isaac Dixon MRN: [id number] Date of Birth: Aug 07, 1920  [facility] CM/SW Contact:  Eileen Stanford, LCSW Phone Number: 08/11/2019, 12:21 PM   Clinical Narrative:  CSW spoke with [facility] and they report pt is from [place]. Pt HH has been arranged through [place].     Final next level of care: Home w Home Health Services Barriers to Discharge: No Barriers Identified   Patient Goals and CMS Choice        Discharge Placement                Patient to be transferred to facility by: family   Patient and family notified of of transfer: 08/11/19  Discharge Plan and Services                          HH Arranged: PT, OT, RN [facility] Agency: [place] ([place]) Date HH Agency Contacted: 08/11/19 Time [place]: 1221 Representative spoke with at [place]: [place] ([place]) Interventions     Readmission Risk Interventions No flowsheet data found.

## 2019-08-11 NOTE — Plan of Care (Signed)

## 2019-08-11 NOTE — Progress Notes (Signed)
Pharmacy Antibiotic Note  Isaac Dixon is a 86 y.o. male admitted on 08/07/2019 with pneumonia.  Pharmacy has been consulted for Zosyn dosing.  Plan: Will change to Zosyn 3.375 g IV q8h per CrCl > 20 ml/min, patient is not on dialysis. Will continue to monitor and adjust doses per changes in renal function.   Height: 5\' 5"  (165.1 cm) Weight: 66.2 kg (145 lb 14.4 oz) IBW/kg (Calculated) : 61.5  Temp (24hrs), Avg:98.1 F (36.7 C), Min:97.8 F (36.6 C), Max:98.8 F (37.1 C)  Recent Labs  Lab 08/07/19 1858 08/07/19 1941 08/07/19 2206 08/08/19 0455 08/08/19 1005 08/09/19 0807 08/10/19 0706 08/11/19 0436  WBC 12.7*  --   --   --  12.4* 15.7* 10.8* 9.9  CREATININE 1.92*  --   --  2.11*  --  2.08* 1.81* 1.83*  LATICACIDVEN  --  2.7* 2.6*  --   --   --   --   --     Estimated Creatinine Clearance: 20.1 mL/min (A) (by C-G formula based on SCr of 1.83 mg/dL (H)).    No Known Allergies   Thank you for allowing pharmacy to be a part of this patient's care.  Rayna Sexton, PharmD, BCPS Clinical Pharmacist 08/11/2019 12:05 PM

## 2019-08-11 NOTE — Discharge Summary (Signed)
Physician Discharge Summary  Travez Stancil VVO:[id number] DOB: Jun 22, 1920 DOA: 08/07/2019  PCP: Venia Carbon, MD  Admit date: 08/07/2019 Discharge date: 08/11/2019  Admitted From: home  Disposition: home w/ home health   Recommendations for Outpatient Follow-up:  1. Follow up with PCP in 1 week 2. Will need a repeat CXR as an outpatient in 6 weeks to confirm clearance of             pneumonia   Home Health: yes Equipment/Devices:  Discharge Condition: stable  CODE STATUS: full  Diet recommendation: Heart Healthy  Brief/Interim Summary: HPI was taken from Dr. Jonelle Sidle: Isaac Dixon is a 86 y.o. male with medical history significant of hypothyroidism, rheumatoid arthritis, TIAs, peripheral neuropathy, chronic kidney disease stage III, who is remarkably healthy for his age presenting with complaint of syncopal episode at home.  Patient was seen by EMS and on arrival in the ER was found to be hypoxic requiring 3 L of oxygen in the beginning.  This is new for the patient.  While still in the ER he was noted to have another episode of altered mental status but this appears to be more tonic-clonic seizures with frothing out.  Patient had loss of bladder control during the episode.  No prior history of seizures.  No history of previous syncopal episode but did have some history of TIA in the past.  Patient is now back to baseline however noted to have significant tachycardia and hypotension.  Chest x-ray and other evaluation indicated possible aspiration pneumonitis versus CHF.  Patient is being admitted to the hospital for evaluation and treatment.  He is fully awake and alert but hard of hearing.  No other complaints at this point and appears to be fully oriented x3.Marland Kitchen  ED Course: Temperature 97.9 blood pressure 89/56 initially currently 97/73 pulse 147 respiratory rate of 45 oxygen sats 96% room air.  Lactic acid of 2.7.  COVID-19 is negative.  CT cervical spine and CT head without contrast all  within normal.  BNP 177 troponin I 29.  Chemistry showed glucose of 170 creatinine 1.9 and BUN 30.  White count showed 12.7 otherwise the rest of the CBC is within normal.  Chest x-ray showed diffusely increased interstitial markings possible interstitial edema or infectious etiology.  Patient suspected to have had aspiration pneumonia he is being admitted to the hospital for treatment.  Hospital course from Dr. Lenise Herald 6/19-6/22/21: Pt presented w/ sepsis from likely aspiration pneumonia. Pt was treated inpatient w/ IV zosyn, bronchodilators, supplemental oxygen and incentive spirometry. Pt was able to be weaned from supplemental oxygen prior to d/c. Pt was d/c home w/ po augmention, steroids, incentive spirometry & flutter valve. Of note, PT/OT saw the pt and recommended home health. Home health was set up prior to d/c by CM.   Discharge Diagnoses:  Principal Problem:   Sepsis ([place]) Active Problems:   Rheumatoid arthritis ([place])   Hypothyroidism   Peripheral neuropathy   Chronic kidney disease, stage III (moderate)   Syncope   Seizure ([place])   Aspiration pneumonia ([place])  Sepsis: secondary to aspiration pneumonia. Continue on IV zosyn. Blood cxs NGTD.  Resolved  Aspiration pneumonia: continues to improve. Continue IV zosyn & bronchodilators. Encourage incentive spirometry   Acute hypoxic respiratory failure: likely secondary to pneumonia. Continue on IV abxs. Weaned off of supplemental oxygen. Resolved  Syncope vs seizure:observed to have seizure in the ER. Etiology unclear. S/p IV keppra x 1. MRI brain shows possible subacute ischemia. EEG showed  no focal lateralizing or epileptiform features.   Rheumatoid arthritis:hold home dose of etanercept while inpatient   Hypothyroidism:continue on home dose of levothyroxine   CKDIIIb: baseline Cr is unknown. Continue on IVFs. Will continue to monitor   Elevated troponin:No chest pain. Likely due to demand ischemia from sepsis.  Echo ordered.   Thromboctyopenia: etiology unclear. Will continue to monitor   Discharge Instructions  Discharge Instructions    Diet general   Complete by: As directed    Discharge instructions   Complete by: As directed    F/u PCP in 1 week. Will repeat chest x-ray in 6 weeks outpatient to confirm the clearance of pneumonia   Increase activity slowly   Complete by: As directed      Allergies as of 08/11/2019   No Known Allergies     Medication List    TAKE these medications   amoxicillin-clavulanate 875-125 MG tablet Commonly known as: Augmentin Take 1 tablet by mouth every 12 (twelve) hours for 4 days.   aspirin 81 MG tablet Take 81 mg by mouth daily.   Enbrel 50 MG/ML injection Generic drug: etanercept Inject 50 mg into the skin once a week.   levothyroxine 50 MCG tablet Commonly known as: SYNTHROID TAKE 1 TABLET BY MOUTH EVERY MORNING ON AN EMPTY STOMACH   predniSONE 20 MG tablet Commonly known as: Deltasone Take 2 tablets (40 mg total) by mouth daily for 5 days.            Durable Medical Equipment  (From admission, onward)         Start     Ordered   08/10/19 1130  For home use only DME 3 n 1  Once        08/10/19 1130          Follow-up Information    [facility] Follow up.   Specialty: Home Health Services Why: Physical Therapy, Occupational Therapy, Nurse             No Known Allergies  Consultations:     Procedures/Studies: EEG  Result Date: 08/10/2019 Alexis Goodell, MD     08/10/2019  2:48 PM ELECTROENCEPHALOGRAM REPORT Patient: Isaac Dixon       Room #: 244A-AA EEG No. ID: [id number] Age: 86 y.o.        Sex: male Requesting Physician: Jimmye Norman Report Date:  08/10/2019       Interpreting Physician: Alexis Goodell History: Kamdin Follett is an 86 y.o. male with witnessed seizure Medications: Synthroid, Zosyn Conditions of Recording:  This is a 21 channel routine scalp EEG performed with bipolar and  monopolar montages arranged in accordance to the international 10/20 system of electrode placement. One channel was dedicated to EKG recording. The patient is in the awake, drowsy and asleep states. Description:  The waking background activity consists of a low voltage, symmetrical, fairly well organized, 7 Hz theta activity, seen from the parieto-occipital and posterior temporal regions.  Low voltage fast activity, poorly organized, is seen anteriorly and is at times superimposed on more posterior regions.  A mixture of theta and alpha rhythms are seen from the central and temporal regions. The patient drowses with slowing to irregular, low voltage theta and beta activity.  The patient goes in to a light sleep with symmetrical sleep spindles, vertex central sharp transients and irregular slow activity.  No epileptiform activity is noted.  Hyperventilation was not performed. Intermittent photic stimulation was performed and elicits a symmetrical driving response but fails  to elicit any abnormalities. IMPRESSION: Normal electroencephalogram, awake, asleep and with activation procedures. There are no focal lateralizing or epileptiform features. Alexis Goodell, MD Neurology [phone] 08/10/2019, 2:46 PM   CT Head Wo Contrast  Result Date: 08/07/2019 CLINICAL DATA:  Possible seizure and subsequent fall. EXAM: CT HEAD WITHOUT CONTRAST TECHNIQUE: Contiguous axial images were obtained from the base of the skull through the vertex without intravenous contrast. COMPARISON:  None. FINDINGS: Brain: There is moderate severity cerebral atrophy with widening of the extra-axial spaces and ventricular dilatation. There are areas of decreased attenuation within the white matter tracts of the supratentorial brain, consistent with microvascular disease changes. A small area of cortical encephalomalacia, with adjacent chronic white matter low attenuation, is seen within the cerebellum on the right. Vascular: No hyperdense vessel  or unexpected calcification. Skull: Normal. Negative for fracture or focal lesion. Sinuses/Orbits: No acute finding. Other: None. IMPRESSION: 1. Generalized cerebral atrophy. 2. No acute intracranial abnormality. Electronically Signed   By: Virgina Norfolk M.D.   On: 08/07/2019 20:36   CT Cervical Spine Wo Contrast  Result Date: 08/07/2019 CLINICAL DATA:  Possible seizure with subsequent fall. EXAM: CT CERVICAL SPINE WITHOUT CONTRAST TECHNIQUE: Multidetector CT imaging of the cervical spine was performed without intravenous contrast. Multiplanar CT image reconstructions were also generated. COMPARISON:  None. FINDINGS: Alignment: Normal. Skull base and vertebrae: No acute fracture. No primary bone lesion or focal pathologic process. Soft tissues and spinal canal: No prevertebral fluid or swelling. No visible canal hematoma. Disc levels: Moderate to marked severity endplate sclerosis is seen at the levels of C4-C5, C5-C6 and C6-C7. Marked severity intervertebral disc space narrowing is also seen at these levels. Moderate severity bilateral multilevel facet joint hypertrophy is seen. Upper chest: Negative. Other: None. IMPRESSION: 1. No acute osseous abnormality. 2. Marked severity multilevel degenerative disc disease and facet joint hypertrophy. Electronically Signed   By: Virgina Norfolk M.D.   On: 08/07/2019 20:39   MR BRAIN WO CONTRAST  Result Date: 08/08/2019 CLINICAL DATA:  Initial evaluation for acute encephalopathy. EXAM: MRI HEAD WITHOUT CONTRAST TECHNIQUE: Multiplanar, multiecho pulse sequences of the brain and surrounding structures were obtained without intravenous contrast. COMPARISON:  Prior head CT from 08/07/2019. FINDINGS: Brain: Diffuse prominence of the CSF containing spaces compatible with generalized age-related cerebral atrophy. Patchy and confluent T2/FLAIR hyperintensity within the periventricular deep white matter both cerebral hemispheres most consistent with chronic small vessel  ischemic disease. Small remote right cerebellar infarct noted. Few additional scattered chronic lacunar infarcts noted involving the pons. 9 mm focus of mild diffusion abnormality seen involving the subcortical and deep white matter of the posterior left frontal centrum semi ovale (series 6, image 35). No associated ADC correlate. Finding likely reflects the sequelae of late subacute small vessel ischemia. No other diffusion abnormality to suggest acute or subacute ischemia. Gray-white matter differentiation otherwise maintained. No encephalomalacia to suggest chronic cortical infarction elsewhere within the brain. No acute intracranial hemorrhage. Single chronic microhemorrhage noted at the posterior left parietal region, of doubtful significance in isolation. No mass lesion, midline shift or mass effect. Diffuse ventricular prominence related to global parenchymal volume loss without hydrocephalus. No extra-axial fluid collection. Pituitary gland and suprasellar region within normal limits. Midline structures intact. Vascular: Major intracranial vascular flow voids are well maintained. Skull and upper cervical spine: Craniocervical junction within normal limits. Bone marrow signal intensity normal. No scalp soft tissue abnormality. Sinuses/Orbits: Patient status post bilateral ocular lens replacement. Globes and orbital soft tissues demonstrate no acute finding. Paranasal  sinuses are largely clear. Small left mastoid effusion noted. Right mastoid air cells clear. Inner ear structures grossly normal. Other: None. IMPRESSION: 1. 9 mm focus of mild diffusion abnormality involving the posterior left cerebral white matter, likely reflecting changes related to late subacute small vessel ischemia. This is not felt to be acute, and is likely incidental in nature. 2. No other acute intracranial abnormality. 3. Generalized age-related cerebral atrophy with chronic small vessel ischemic disease. Superimposed small right  cerebellar infarct with additional lacunar infarcts at the pons. Electronically Signed   By: Jeannine Boga M.D.   On: 08/08/2019 05:29   DG Chest Portable 1 View  Result Date: 08/07/2019 CLINICAL DATA:  Shortness of breath, syncope EXAM: PORTABLE CHEST 1 VIEW COMPARISON:  February 26, 2018 FINDINGS: There is mild cardiomegaly. Aortic knob calcifications. Prominence of the central pulmonary vasculature with mildly increased interstitial markings seen throughout both lungs. There are chronic bronchial wall thickening and interstitial markings at both lung bases. No pleural effusion. No acute osseous abnormality. IMPRESSION: Diffusely increased interstitial markings which could be due to interstitial edema and/or infectious etiology. Electronically Signed   By: Prudencio Pair M.D.   On: 08/07/2019 19:28   ECHOCARDIOGRAM COMPLETE  Result Date: 08/10/2019    ECHOCARDIOGRAM REPORT   Patient Name:   ADIS STURGILL Date of Exam: 08/10/2019 Medical Rec #:  [id number]     Height:       65.0 in Accession #:    [id number]    Weight:       148.3 lb Date of Birth:  Oct 19, 1920      BSA:          1.742 m Patient Age:    86 years      BP:           154/87 mmHg Patient Gender: M             HR:           73 bpm. Exam Location:  [facility] Procedure: 2D Echo, Cardiac Doppler and Color Doppler Indications:     Syncope 780.2  History:         Patient has no prior history of Echocardiogram examinations.                  TIA.  Sonographer:     Sherrie Sport RDCS (AE) Referring Phys:  [place] Diagnosing Phys: Kathlyn Sacramento MD  Sonographer Comments: Technically challenging study due to limited acoustic windows and no apical window. Image acquisition challenging due to patient body habitus. IMPRESSIONS  1. Left ventricular ejection fraction, by estimation, is 60 to 65%. The left ventricle has normal function. The left ventricle has no regional wall motion abnormalities. There is mild left ventricular hypertrophy. Left  ventricular diastolic parameters are indeterminate.  2. Right ventricular systolic function is normal. The right ventricular size is normal. There is moderately elevated pulmonary artery systolic pressure.  3. The mitral valve is normal in structure. No evidence of mitral valve regurgitation. No evidence of mitral stenosis.  4. The aortic valve is abnormal. Aortic valve regurgitation is trivial. Mild to moderate aortic valve sclerosis/calcification is present. Gradient was not measured due to lack of apical windows. Mild/moderate aortic stenosis can not be excluded.  5. The inferior vena cava is normal in size with <50% respiratory variability, suggesting right atrial pressure of 8 mmHg. FINDINGS  Left Ventricle: Left ventricular ejection fraction, by estimation, is 60 to 65%. The left ventricle has normal  function. The left ventricle has no regional wall motion abnormalities. The left ventricular internal cavity size was normal in size. There is  mild left ventricular hypertrophy. Left ventricular diastolic parameters are indeterminate. Right Ventricle: The right ventricular size is normal. No increase in right ventricular wall thickness. Right ventricular systolic function is normal. There is moderately elevated pulmonary artery systolic pressure. The tricuspid regurgitant velocity is 3.47 m/s, and with an assumed right atrial pressure of 8 mmHg, the estimated right ventricular systolic pressure is 24.2 mmHg. Left Atrium: Left atrial size was normal in size. Right Atrium: Right atrial size was normal in size. Pericardium: There is no evidence of pericardial effusion. Mitral Valve: The mitral valve is normal in structure. Normal mobility of the mitral valve leaflets. No evidence of mitral valve regurgitation. No evidence of mitral valve stenosis. Tricuspid Valve: The tricuspid valve is normal in structure. Tricuspid valve regurgitation is mild . No evidence of tricuspid stenosis. Aortic Valve: The aortic valve is  abnormal. Aortic valve regurgitation is trivial. Mild to moderate aortic valve sclerosis/calcification is present, without any evidence of aortic stenosis. Pulmonic Valve: The pulmonic valve was normal in structure. Pulmonic valve regurgitation is mild. No evidence of pulmonic stenosis. Aorta: The aortic root is normal in size and structure. Venous: The inferior vena cava is normal in size with less than 50% respiratory variability, suggesting right atrial pressure of 8 mmHg. IAS/Shunts: No atrial level shunt detected by color flow Doppler.  LEFT VENTRICLE PLAX 2D LVIDd:         3.82 cm LVIDs:         2.25 cm LV PW:         1.10 cm LV IVS:        1.40 cm LVOT diam:     2.20 cm LVOT Area:     3.80 cm  LEFT ATRIUM         Index LA diam:    2.90 cm 1.66 cm/m                        PULMONIC VALVE AORTA                 PV Vmax:        0.42 m/s Ao Root diam: 3.00 cm PV Peak grad:   0.7 mmHg                       RVOT Peak grad: 2 mmHg  TRICUSPID VALVE TR Peak grad:   48.2 mmHg TR Vmax:        347.00 cm/s  SHUNTS Systemic Diam: 2.20 cm Kathlyn Sacramento MD Electronically signed by Kathlyn Sacramento MD Signature Date/Time: 08/10/2019/1:44:40 PM    Final       Subjective: Pt c/o intermittent cough    Discharge Exam: Vitals:   08/11/19 0749 08/11/19 1209  BP: (!) 151/84 132/80  Pulse: 79 84  Resp: 18 18  Temp: 98.1 F (36.7 C) 98.6 F (37 C)  SpO2: 98% 93%   Vitals:   08/11/19 0318 08/11/19 0504 08/11/19 0749 08/11/19 1209  BP:  138/81 (!) 151/84 132/80  Pulse:  84 79 84  Resp:  20 18 18   Temp:  97.8 F (36.6 C) 98.1 F (36.7 C) 98.6 F (37 C)  TempSrc:  Oral    SpO2:  94% 98% 93%  Weight: 66.2 kg     Height:        General: Pt  is alert, awake, not in acute distress Cardiovascular: S1/S2 +, no rubs, no gallops Respiratory: decreased breath sounds b/l, no wheezing, no rhonchi Abdominal: Soft, NT, ND, bowel sounds + Extremities: no edema, no cyanosis    The results of significant diagnostics  from this hospitalization (including imaging, microbiology, ancillary and laboratory) are listed below for reference.     Microbiology: Recent Results (from the past 240 hour(s))  Blood culture (routine x 2)     Status: None (Preliminary result)   Collection Time: 08/07/19  7:41 PM   Specimen: BLOOD  Result Value Ref Range Status   Specimen Description BLOOD  Final   Special Requests BOTTLES DRAWN AEROBIC AND ANAEROBIC  Final   Culture   Final    NO GROWTH 4 DAYS Performed at [facility], [address] [place], [place] [address]    Report Status PENDING  Incomplete  Blood culture (routine x 2)     Status: None (Preliminary result)   Collection Time: 08/07/19  7:46 PM   Specimen: BLOOD  Result Value Ref Range Status   Specimen Description BLOOD  Final   Special Requests BOTTLES DRAWN AEROBIC AND ANAEROBIC  Final   Culture   Final    NO GROWTH 4 DAYS Performed at [facility], [address] [place], [place] [address]    Report Status PENDING  Incomplete  SARS Coronavirus 2 by RT PCR (hospital order, performed in [place] hospital lab) Nasopharyngeal Nasopharyngeal Swab     Status: None   Collection Time: 08/07/19  7:55 PM   Specimen: Nasopharyngeal Swab  Result Value Ref Range Status   SARS Coronavirus 2 NEGATIVE NEGATIVE Final    Comment: (NOTE) SARS-CoV-2 target nucleic acids are NOT DETECTED.  The SARS-CoV-2 RNA is generally detectable in upper and lower respiratory specimens during the acute phase of infection. The lowest concentration of SARS-CoV-2 viral copies this assay can detect is 250 copies / mL. A negative result does not preclude SARS-CoV-2 infection and should not be used as the sole basis for treatment or other patient management decisions.  A negative result may occur with improper specimen collection / handling, submission of specimen other than nasopharyngeal swab, presence of viral mutation(s) within the areas targeted by this  assay, and inadequate number of viral copies (<250 copies / mL). A negative result must be combined with clinical observations, patient history, and epidemiological information.  Fact Sheet for Patients:   StrictlyIdeas.no  Fact Sheet for Healthcare Providers: BankingDealers.co.za  This test is not yet approved or  cleared by the [place] FDA and has been authorized for detection and/or diagnosis of SARS-CoV-2 by FDA under an Emergency Use Authorization (EUA).  This EUA will remain in effect (meaning this test can be used) for the duration of the COVID-19 declaration under Section 564(b)(1) of the Act, 21 U.S.C. section 360bbb-3(b)(1), unless the authorization is terminated or revoked sooner.  Performed at [facility], [address] [place], [place] [address]      Labs: BNP (last 3 results) Recent Labs    08/07/19 2107  BNP 892.1*   Basic Metabolic Panel: Recent Labs  Lab 08/07/19 1858 08/07/19 2345 08/08/19 0455 08/09/19 0807 08/10/19 0706 08/11/19 0436  NA 137  --  134* 136 138 140  K 3.9  --  4.7 4.4 4.3 3.9  CL 104  --  106 108 110 112*  CO2 21*  --  17* 21* 23 22  GLUCOSE 170*  --  231*  105* 97 104*  BUN 30*  --  34* 45* 39* 33*  CREATININE 1.92*  --  2.11* 2.08* 1.81* 1.83*  CALCIUM 8.9  --  8.6* 8.7* 8.5* 8.4*  MG  --  2.1  --   --   --   --    Liver Function Tests: Recent Labs  Lab 08/07/19 1858 08/08/19 0455  AST 23 28  ALT 20 20  ALKPHOS 73 63  BILITOT 0.7 0.8  PROT 7.9 7.2  ALBUMIN 3.7 3.3*   No results for input(s): LIPASE, AMYLASE in the last 168 hours. No results for input(s): AMMONIA in the last 168 hours. CBC: Recent Labs  Lab 08/07/19 1858 08/08/19 1005 08/09/19 0807 08/10/19 0706 08/11/19 0436  WBC 12.7* 12.4* 15.7* 10.8* 9.9  NEUTROABS 8.6*  --   --   --   --   HGB 14.6 14.1 13.5 14.3 14.4  HCT 43.8 41.7 40.5 41.3 41.5  MCV 93.0 90.8 92.3 88.6 88.1  PLT 154  150 140* 147* 148*   Cardiac Enzymes: No results for input(s): CKTOTAL, CKMB, CKMBINDEX, TROPONINI in the last 168 hours. BNP: Invalid input(s): POCBNP CBG: No results for input(s): GLUCAP in the last 168 hours. D-Dimer No results for input(s): DDIMER in the last 72 hours. Hgb A1c No results for input(s): HGBA1C in the last 72 hours. Lipid Profile No results for input(s): CHOL, HDL, LDLCALC, TRIG, CHOLHDL, LDLDIRECT in the last 72 hours. Thyroid function studies No results for input(s): TSH, T4TOTAL, T3FREE, THYROIDAB in the last 72 hours.  Invalid input(s): FREET3 Anemia work up No results for input(s): VITAMINB12, FOLATE, FERRITIN, TIBC, IRON, RETICCTPCT in the last 72 hours. Urinalysis    Component Value Date/Time   BILIRUBINUR Negative 09/30/2017 0958   PROTEINUR Positive (A) 09/30/2017 0958   UROBILINOGEN 0.2 09/30/2017 0958   NITRITE Positive 09/30/2017 0958   LEUKOCYTESUR Large (3+) (A) 09/30/2017 0958   Sepsis Labs Invalid input(s): PROCALCITONIN,  WBC,  LACTICIDVEN Microbiology Recent Results (from the past 240 hour(s))  Blood culture (routine x 2)     Status: None (Preliminary result)   Collection Time: 08/07/19  7:41 PM   Specimen: BLOOD  Result Value Ref Range Status   Specimen Description BLOOD  Final   Special Requests BOTTLES DRAWN AEROBIC AND ANAEROBIC  Final   Culture   Final    NO GROWTH 4 DAYS Performed at [facility], [address] [place], [place] [address]    Report Status PENDING  Incomplete  Blood culture (routine x 2)     Status: None (Preliminary result)   Collection Time: 08/07/19  7:46 PM   Specimen: BLOOD  Result Value Ref Range Status   Specimen Description BLOOD  Final   Special Requests BOTTLES DRAWN AEROBIC AND ANAEROBIC  Final   Culture   Final    NO GROWTH 4 DAYS Performed at [facility], [address] [place], [place] [address]    Report Status PENDING  Incomplete  SARS Coronavirus 2 by RT PCR  (hospital order, performed in [place] hospital lab) Nasopharyngeal Nasopharyngeal Swab     Status: None   Collection Time: 08/07/19  7:55 PM   Specimen: Nasopharyngeal Swab  Result Value Ref Range Status   SARS Coronavirus 2 NEGATIVE NEGATIVE Final    Comment: (NOTE) SARS-CoV-2 target nucleic acids are NOT DETECTED.  The SARS-CoV-2 RNA is generally detectable in upper and lower respiratory specimens during the acute phase of infection. The lowest concentration of SARS-CoV-2  viral copies this assay can detect is 250 copies / mL. A negative result does not preclude SARS-CoV-2 infection and should not be used as the sole basis for treatment or other patient management decisions.  A negative result may occur with improper specimen collection / handling, submission of specimen other than nasopharyngeal swab, presence of viral mutation(s) within the areas targeted by this assay, and inadequate number of viral copies (<250 copies / mL). A negative result must be combined with clinical observations, patient history, and epidemiological information.  Fact Sheet for Patients:   StrictlyIdeas.no  Fact Sheet for Healthcare Providers: BankingDealers.co.za  This test is not yet approved or  cleared by the [place] FDA and has been authorized for detection and/or diagnosis of SARS-CoV-2 by FDA under an Emergency Use Authorization (EUA).  This EUA will remain in effect (meaning this test can be used) for the duration of the COVID-19 declaration under Section 564(b)(1) of the Act, 21 U.S.C. section 360bbb-3(b)(1), unless the authorization is terminated or revoked sooner.  Performed at [facility], [address] [place], [place] [address]      Time coordinating discharge: Over 30 minutes  SIGNED:   Wyvonnia Dusky, MD  [facility] 08/11/2019, 2:50 PM Pager   If 7PM-7AM, please contact  night-coverage [email or web address]

## 2019-08-11 NOTE — Progress Notes (Signed)
Patient discharged back to home. Follow up appointments and prescriptions reviewed. Incentive spirometry and flutter valve also sent with patient

## 2019-08-11 NOTE — Telephone Encounter (Signed)
Okay to give at [facility] and 4:15PM on Monday Block the 2:45 same day

## 2019-08-11 NOTE — Telephone Encounter (Signed)
Patient's Partner [name] called today She stated that they are both needing a hospital follow up and will need to come together  ED advised that they needed to be seen in 1 week Is there somewhere you could work them both in ?

## 2019-08-12 ENCOUNTER — Telehealth: Payer: Self-pay | Admitting: Internal Medicine

## 2019-08-12 ENCOUNTER — Telehealth: Payer: Self-pay

## 2019-08-12 DIAGNOSIS — M05741 Rheumatoid arthritis with rheumatoid factor of right hand without organ or systems involvement: Secondary | ICD-10-CM | POA: Diagnosis not present

## 2019-08-12 DIAGNOSIS — A419 Sepsis, unspecified organism: Secondary | ICD-10-CM | POA: Diagnosis not present

## 2019-08-12 DIAGNOSIS — G4089 Other seizures: Secondary | ICD-10-CM | POA: Diagnosis not present

## 2019-08-12 DIAGNOSIS — J690 Pneumonitis due to inhalation of food and vomit: Secondary | ICD-10-CM | POA: Diagnosis not present

## 2019-08-12 DIAGNOSIS — R2681 Unsteadiness on feet: Secondary | ICD-10-CM | POA: Diagnosis not present

## 2019-08-12 DIAGNOSIS — E039 Hypothyroidism, unspecified: Secondary | ICD-10-CM | POA: Diagnosis not present

## 2019-08-12 DIAGNOSIS — Z741 Need for assistance with personal care: Secondary | ICD-10-CM | POA: Diagnosis not present

## 2019-08-12 DIAGNOSIS — J189 Pneumonia, unspecified organism: Secondary | ICD-10-CM | POA: Diagnosis not present

## 2019-08-12 DIAGNOSIS — Z8673 Personal history of transient ischemic attack (TIA), and cerebral infarction without residual deficits: Secondary | ICD-10-CM | POA: Diagnosis not present

## 2019-08-12 DIAGNOSIS — G40901 Epilepsy, unspecified, not intractable, with status epilepticus: Secondary | ICD-10-CM | POA: Diagnosis not present

## 2019-08-12 DIAGNOSIS — M05742 Rheumatoid arthritis with rheumatoid factor of left hand without organ or systems involvement: Secondary | ICD-10-CM | POA: Diagnosis not present

## 2019-08-12 DIAGNOSIS — R278 Other lack of coordination: Secondary | ICD-10-CM | POA: Diagnosis not present

## 2019-08-12 DIAGNOSIS — N400 Enlarged prostate without lower urinary tract symptoms: Secondary | ICD-10-CM | POA: Diagnosis not present

## 2019-08-12 DIAGNOSIS — R569 Unspecified convulsions: Secondary | ICD-10-CM | POA: Diagnosis not present

## 2019-08-12 DIAGNOSIS — M069 Rheumatoid arthritis, unspecified: Secondary | ICD-10-CM | POA: Diagnosis not present

## 2019-08-12 DIAGNOSIS — M6281 Muscle weakness (generalized): Secondary | ICD-10-CM | POA: Diagnosis not present

## 2019-08-12 DIAGNOSIS — G629 Polyneuropathy, unspecified: Secondary | ICD-10-CM | POA: Diagnosis not present

## 2019-08-12 DIAGNOSIS — N183 Chronic kidney disease, stage 3 unspecified: Secondary | ICD-10-CM | POA: Diagnosis not present

## 2019-08-12 LAB — CULTURE, BLOOD (ROUTINE X 2)
Culture: NO GROWTH
Culture: NO GROWTH

## 2019-08-12 NOTE — Telephone Encounter (Signed)
Transition Care Management Follow-up Telephone Call  Date of discharge and from where: 08/11/2019, [facility]  How have you been since you were released from the hospital? Patient is doing okay since his discharge.   Any questions or concerns? No   Items Reviewed:  Did the pt receive and understand the discharge instructions provided? Yes   Medications obtained and verified? Yes   Any new allergies since your discharge? No   Dietary orders reviewed? Yes  Do you have support at home? Yes   Functional Questionnaire: (I = Independent and D = Dependent) ADLs: I  Bathing/Dressing- I  Meal Prep- I  Eating- I  Maintaining continence- I  Transferring/Ambulation- I  Managing Meds- I  Follow up appointments reviewed:   PCP Hospital f/u appt confirmed? Yes  Scheduled to see Dr. Silvio Pate on 08/17/2019 @ 4:15 pm.  [facility] f/u appt confirmed? Yes  .  Are transportation arrangements needed? No  If their condition worsens, is the pt aware to call PCP or go to the Emergency Dept.? Yes  Was the patient provided with contact information for the PCP's office or ED? Yes  Was to pt encouraged to call back with questions or concerns? Yes

## 2019-08-12 NOTE — Telephone Encounter (Signed)
I did receive the Fl-2 and signed it It was faxed back by Mearl Latin I will check on him tomorrow at [facility]

## 2019-08-12 NOTE — Telephone Encounter (Signed)
Gwenevere Abbot, Education officer, museum with [facility] called today She stated she faxed over an [employer] form to [place] today that she needed filled out asap. Patient was d/c from the hospital and really needed to be admitted into [facility] today  She would like this done as soon as you can so they can go ahead and get the patient into [facility].   C/B # [phone]  Fax # [phone]

## 2019-08-13 DIAGNOSIS — J690 Pneumonitis due to inhalation of food and vomit: Secondary | ICD-10-CM | POA: Diagnosis not present

## 2019-08-13 DIAGNOSIS — M05741 Rheumatoid arthritis with rheumatoid factor of right hand without organ or systems involvement: Secondary | ICD-10-CM | POA: Diagnosis not present

## 2019-08-13 DIAGNOSIS — N400 Enlarged prostate without lower urinary tract symptoms: Secondary | ICD-10-CM | POA: Diagnosis not present

## 2019-08-13 DIAGNOSIS — M05742 Rheumatoid arthritis with rheumatoid factor of left hand without organ or systems involvement: Secondary | ICD-10-CM | POA: Diagnosis not present

## 2019-08-13 DIAGNOSIS — G4089 Other seizures: Secondary | ICD-10-CM | POA: Diagnosis not present

## 2019-08-14 ENCOUNTER — Encounter: Payer: Self-pay | Admitting: Internal Medicine

## 2019-08-14 DIAGNOSIS — G40901 Epilepsy, unspecified, not intractable, with status epilepticus: Secondary | ICD-10-CM | POA: Diagnosis not present

## 2019-08-17 ENCOUNTER — Ambulatory Visit: Payer: Medicare Other | Admitting: Internal Medicine

## 2019-09-20 DEATH — deceased

## 2019-12-22 ENCOUNTER — Encounter: Payer: Medicare Other | Admitting: Internal Medicine

## 2021-03-23 IMAGING — CR DG SACRUM/COCCYX 2+V
1 series · 3 of 3 positions shown · non-contrast
Comparison: None.

CLINICAL DATA: Fall

EXAM:
SACRUM AND COCCYX - 2+ VIEW

[Series 1: dg sacrum/coccyx · 0.14mm/px · 3 of 3 slices shown]
[im 1/3]
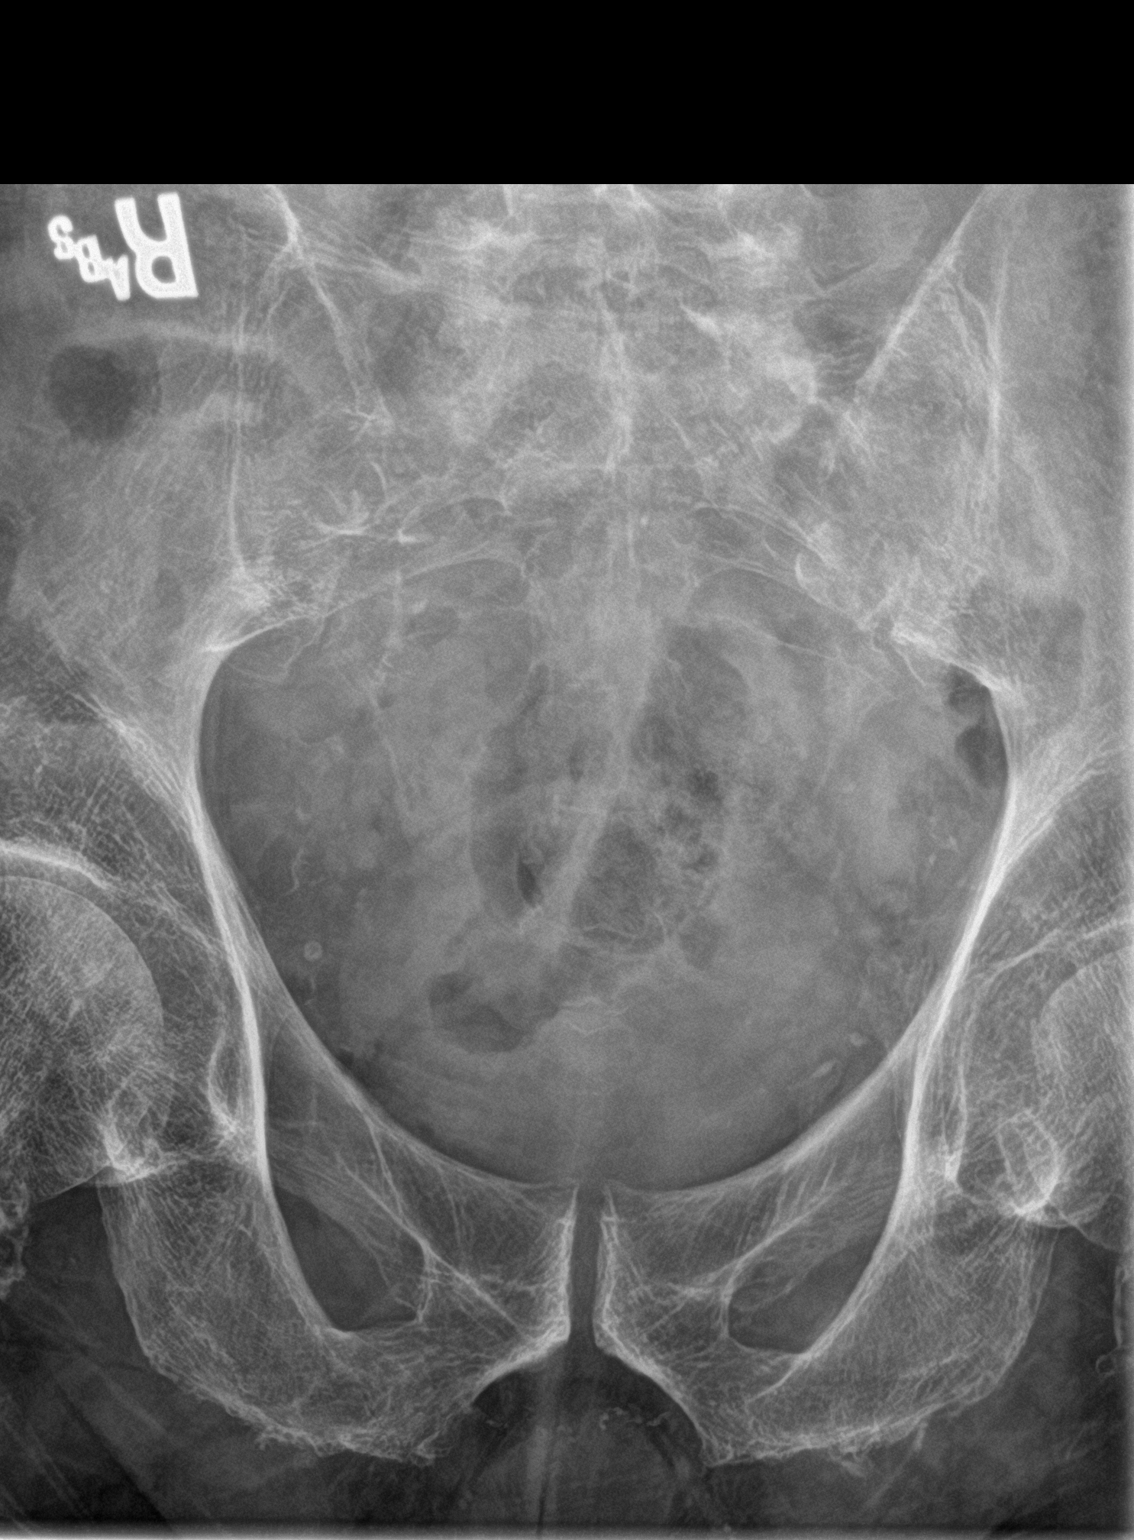
[im 2/3]
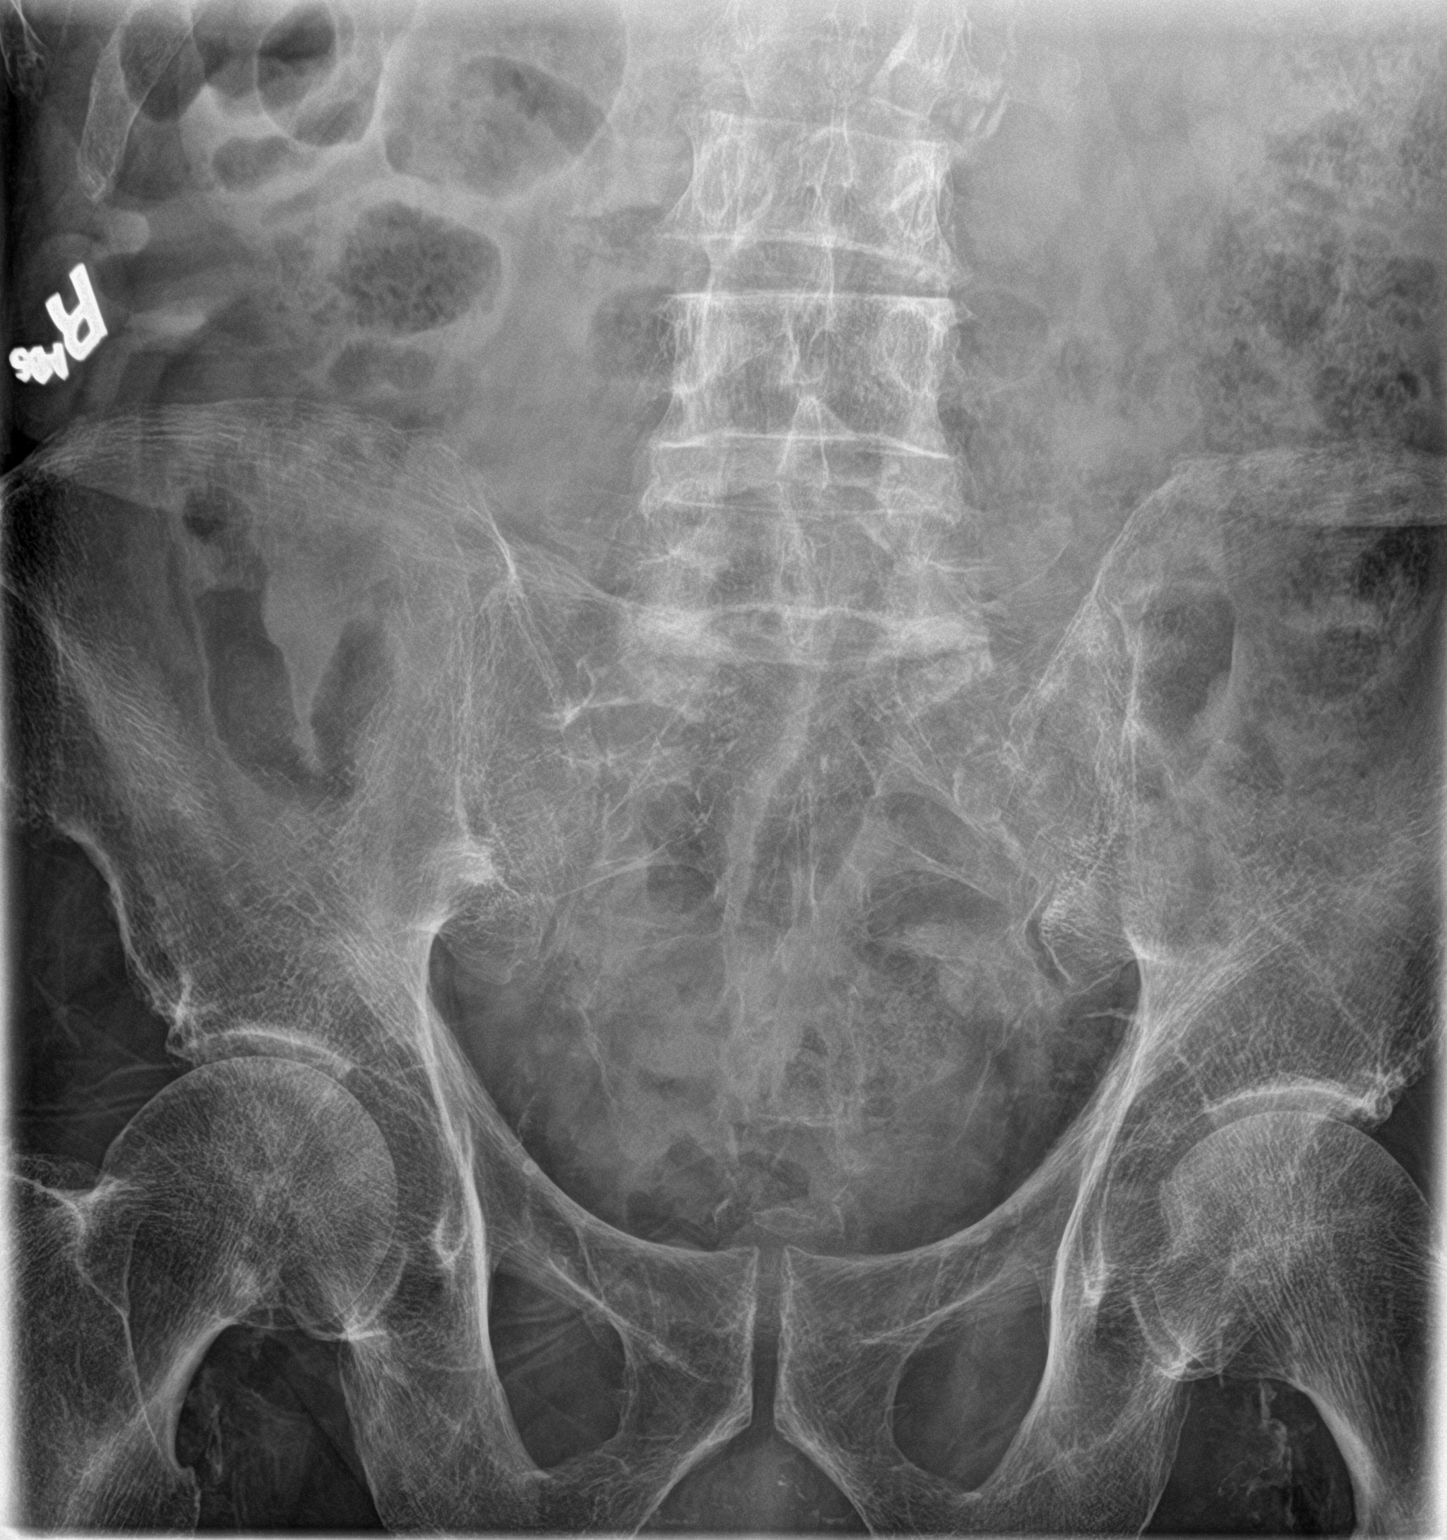
[im 3/3]
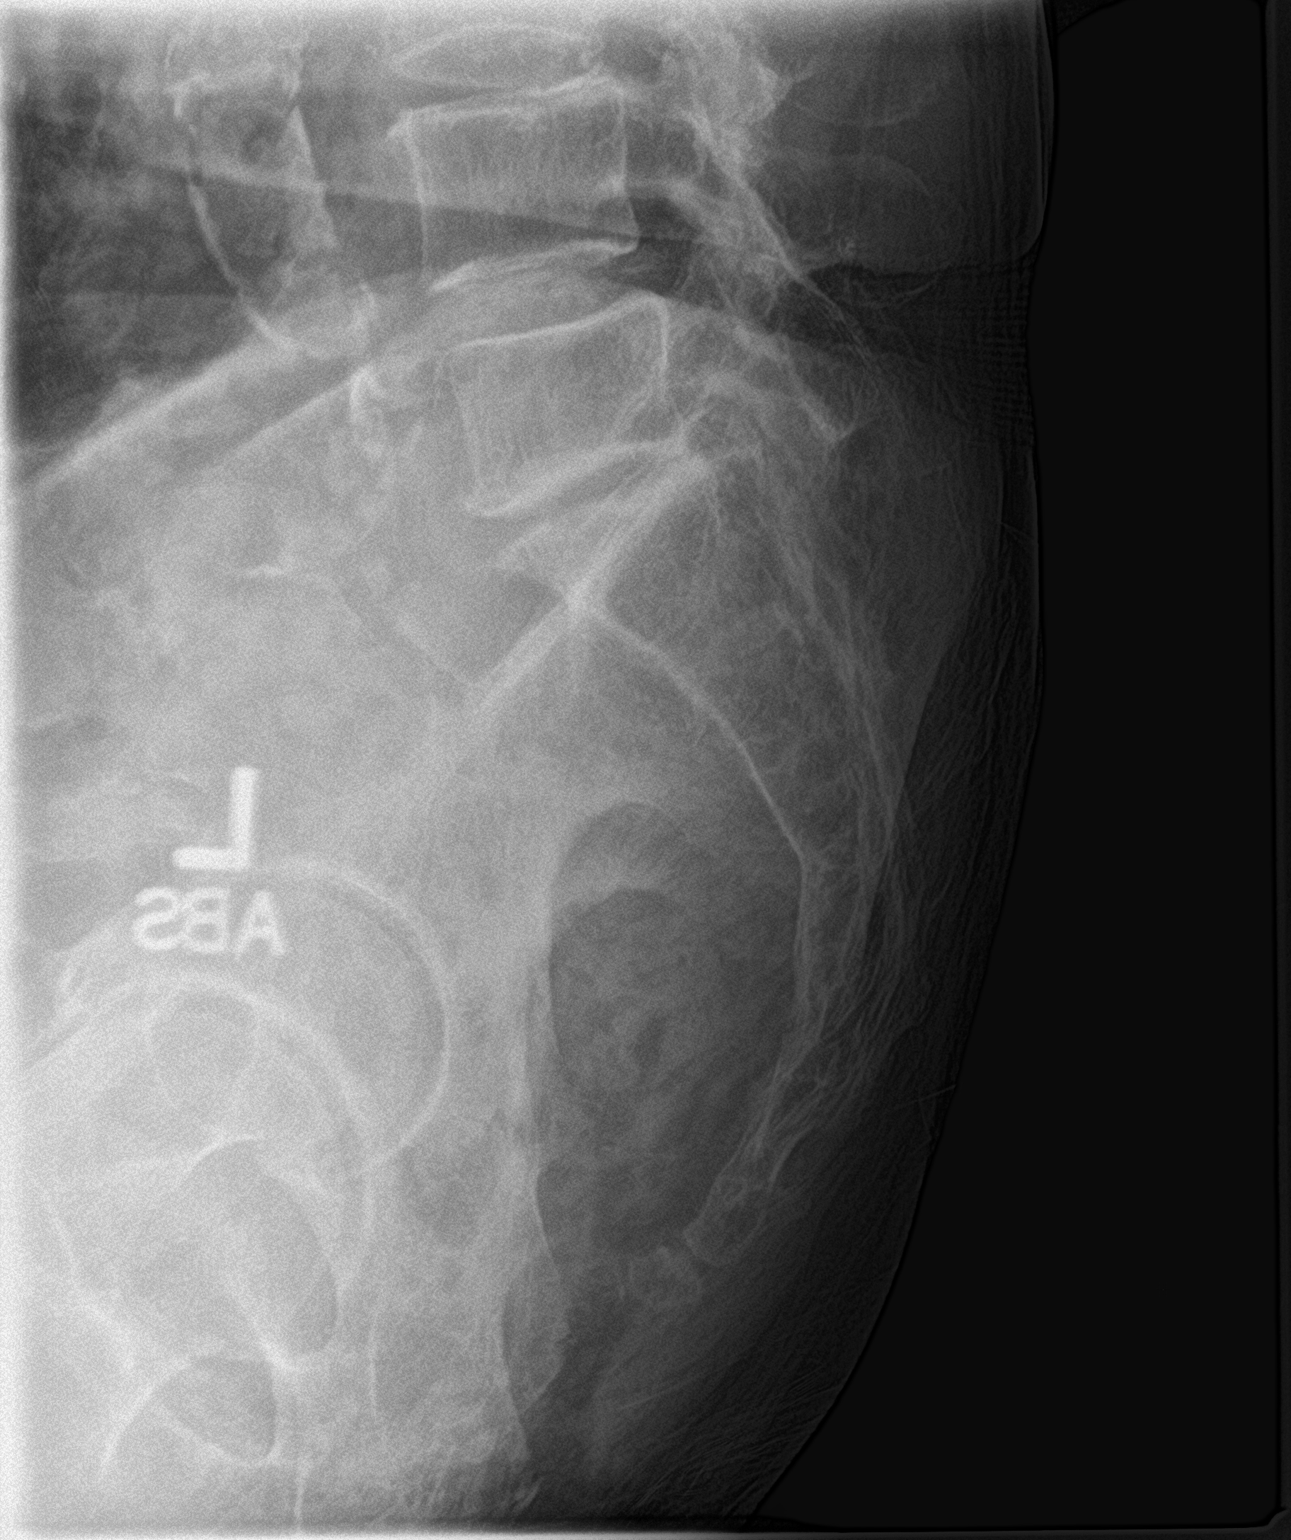

[3 of 3 positions shown; findings below may reference images not displayed]

FINDINGS: No fracture or dislocation is seen.

Visualized bony pelvis appears intact.

No displaced sacrococcygeal fracture is seen.
IMPRESSION: Negative.

## 2021-09-17 IMAGING — CT CT CERVICAL SPINE W/O CM
3 of 4 series · 12 of 35 positions shown, 14 images · non-contrast
Comparison: None.

CLINICAL DATA: Possible seizure with subsequent fall.

EXAM:
CT CERVICAL SPINE WITHOUT CONTRAST
TECHNIQUE: Multidetector CT imaging of the cervical spine was performed without
intravenous contrast. Multiplanar CT image reconstructions were also
generated.

[Series 6: sagittal bone · sagittal · 0.25mm/px · 5 of 52 slices shown, 6 images]
[im 18/52  bone]
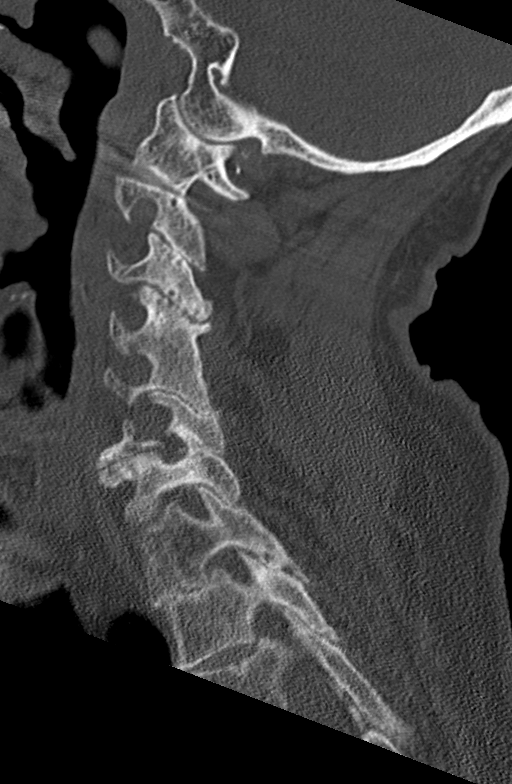
[im 22/52  bone]
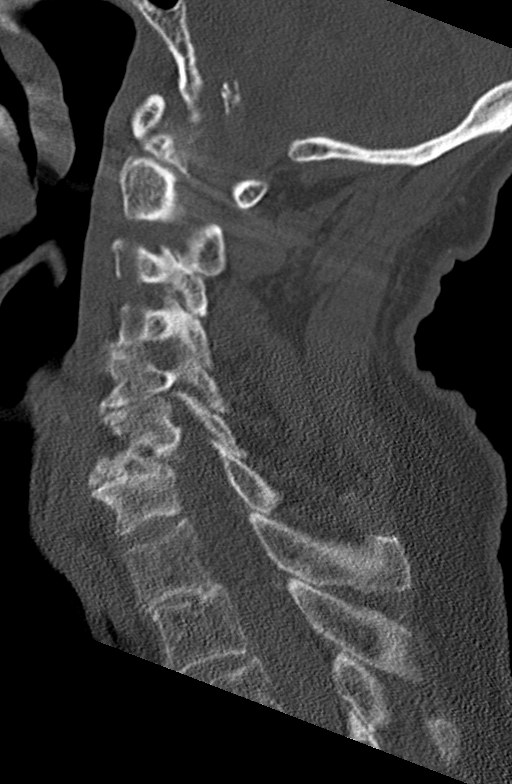
[im 26/52  soft-tissue]
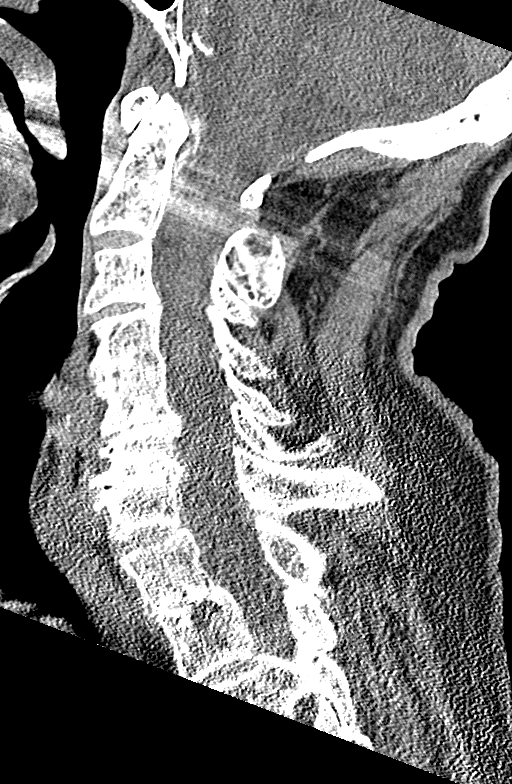
[im 26/52  bone]
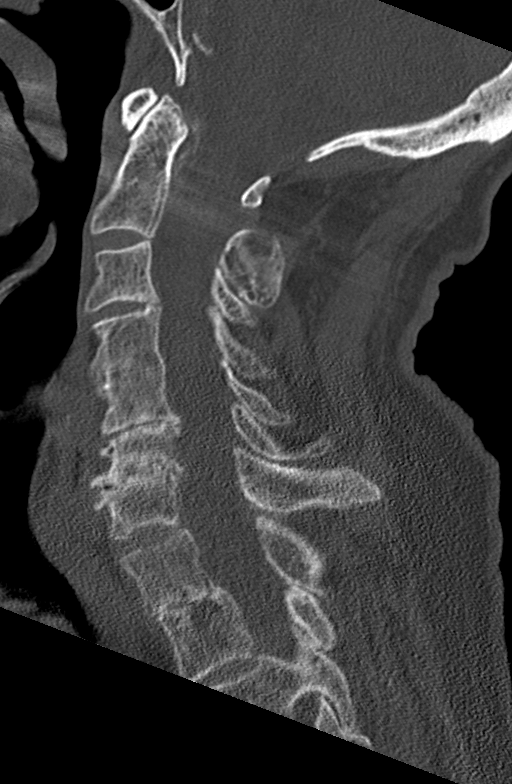
[im 30/52  bone]
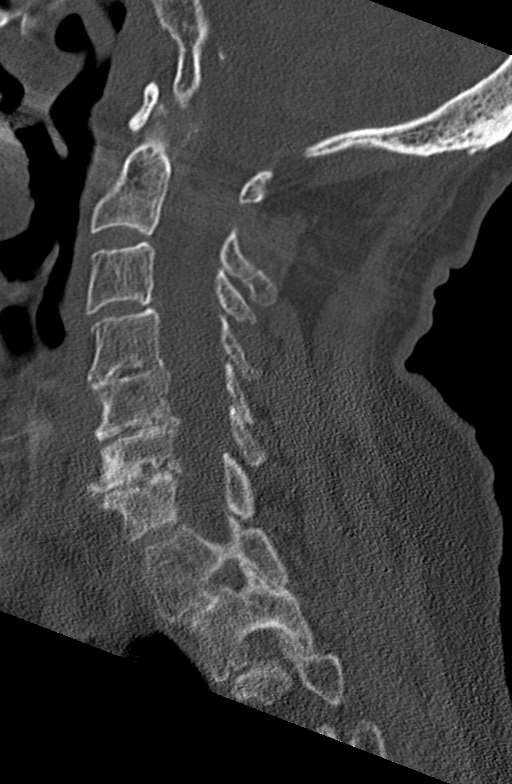
[im 35/52  bone]
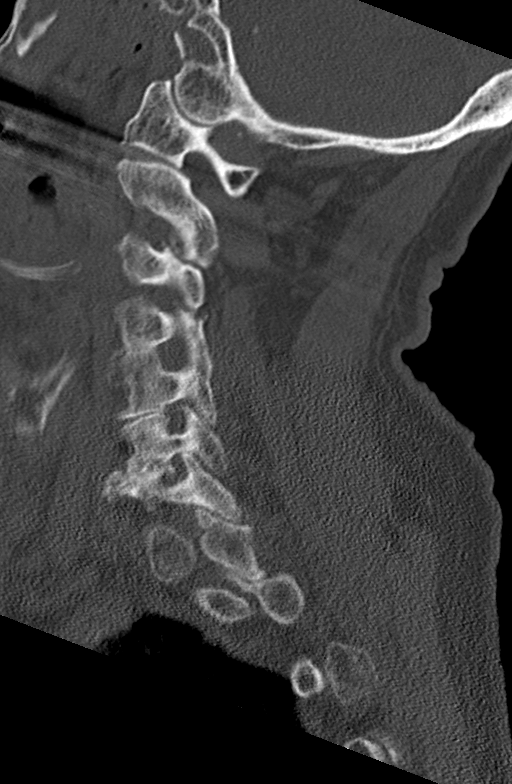

[Series 7: coronal bone · coronal · 0.25mm/px · 3 of 58 slices shown]
[im 12/58  bone]
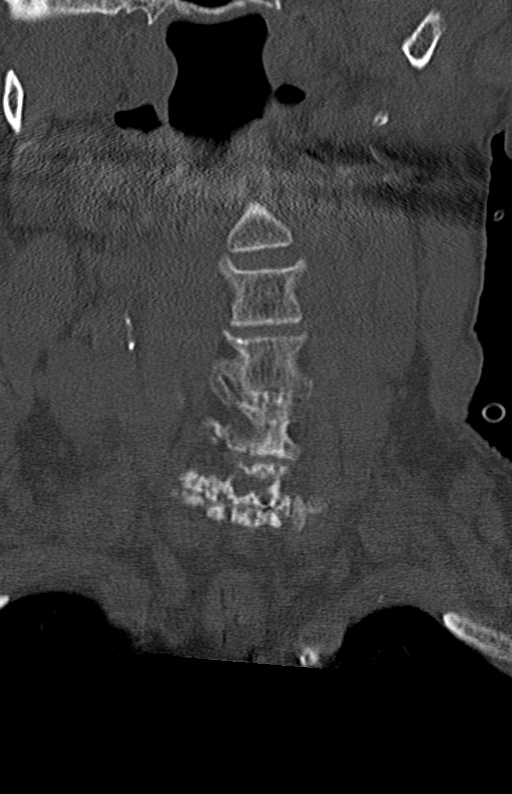
[im 23/58  bone]
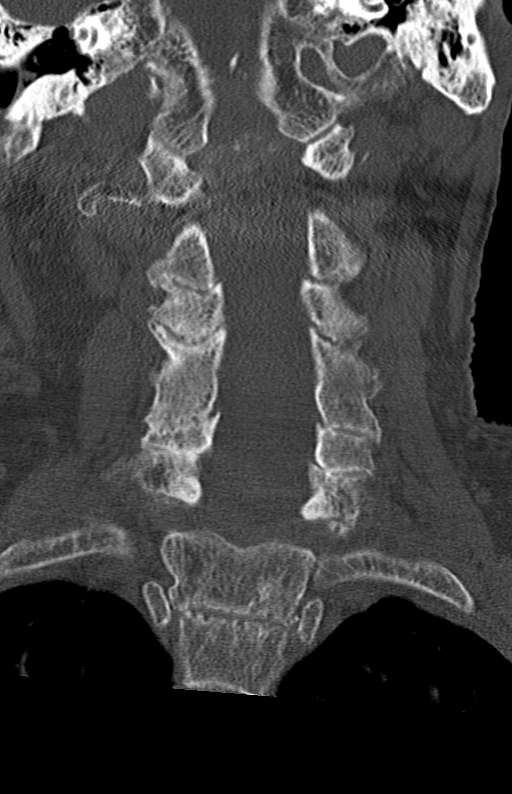
[im 35/58  bone]
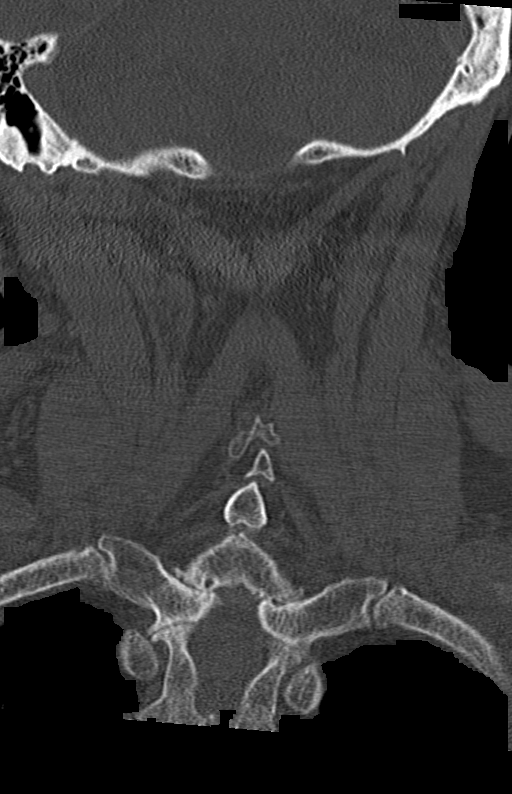

[Series 8: orthogonal bone · axial · 0.21mm/px · z∈[-285,-158]mm · 4 of 102 slices shown, 5 images]
[im 15/102  soft-tissue]
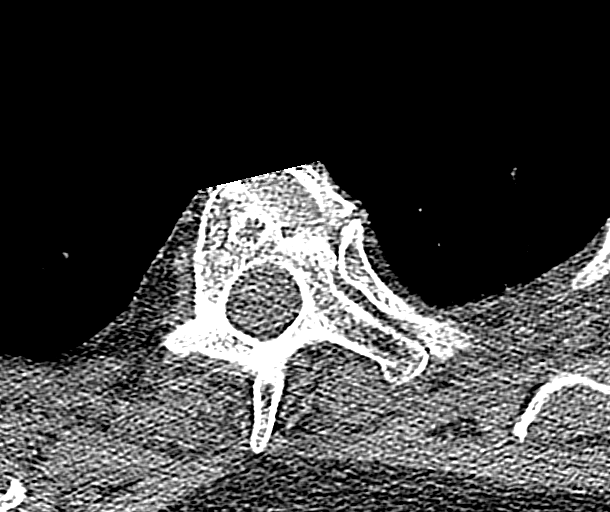
[im 15/102  bone]
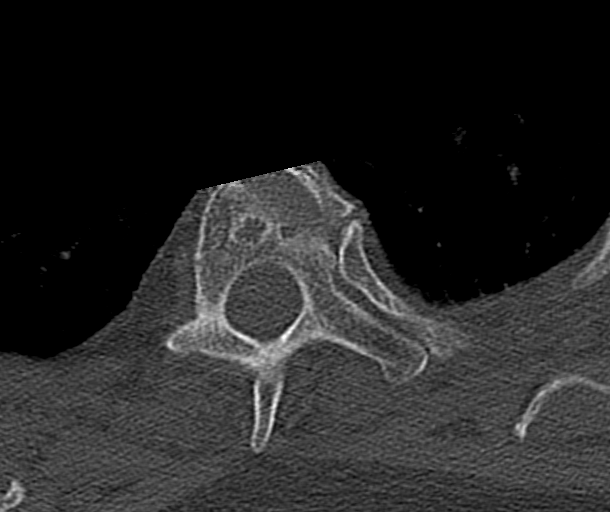
[im 44/102  bone]
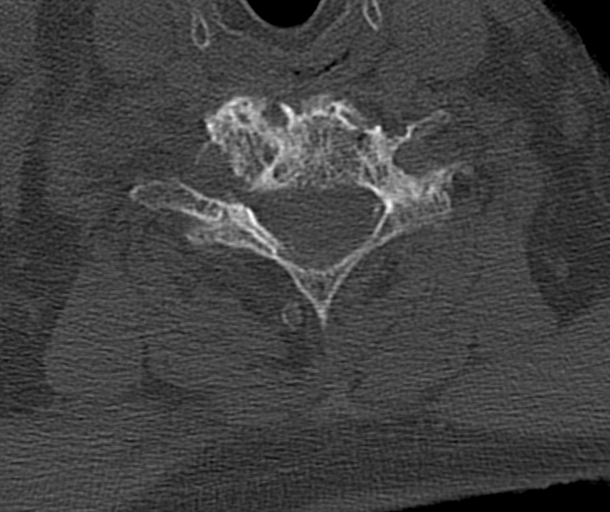
[im 58/102  bone]
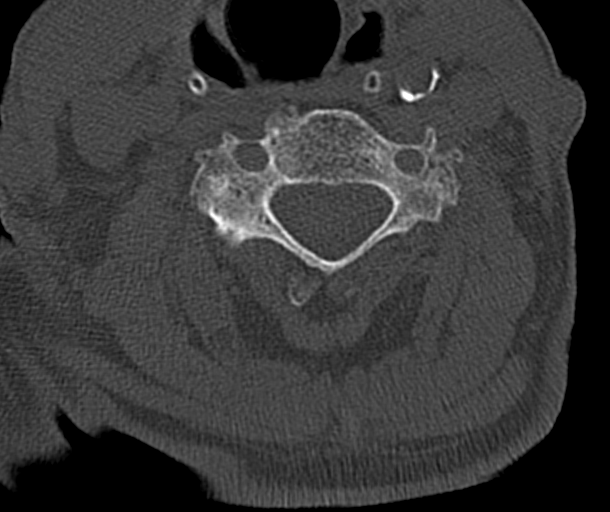
[im 87/102  bone]
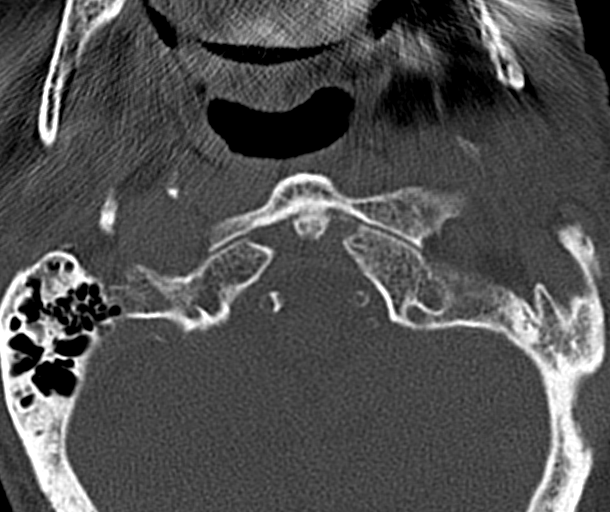

[12 of 35 positions shown; findings below may reference images not displayed]

FINDINGS: Alignment: Normal.

Skull base and vertebrae: No acute fracture. No primary bone lesion
or focal pathologic process.

Soft tissues and spinal canal: No prevertebral fluid or swelling. No
visible canal hematoma.

Disc levels: Moderate to marked severity endplate sclerosis is seen
at the levels of C4-C5, C5-C6 and C6-C7. Marked severity
intervertebral disc space narrowing is also seen at these levels.

Moderate severity bilateral multilevel facet joint hypertrophy is
seen.

Upper chest: Negative.

Other: None.
IMPRESSION: 1. No acute osseous abnormality.
2. Marked severity multilevel degenerative disc disease and facet
joint hypertrophy.

## 2021-09-17 IMAGING — CT CT HEAD W/O CM
3 series · 16 of 47 positions shown, 19 images · non-contrast
Comparison: None.

CLINICAL DATA: Possible seizure and subsequent fall.

EXAM:
CT HEAD WITHOUT CONTRAST
TECHNIQUE: Contiguous axial images were obtained from the base of the skull
through the vertex without intravenous contrast.

[Series 3: head wo · axial · 0.44mm/px · z∈[-107,+33]mm · 10 of 34 slices shown, 13 images]
[im 3/34  brain]
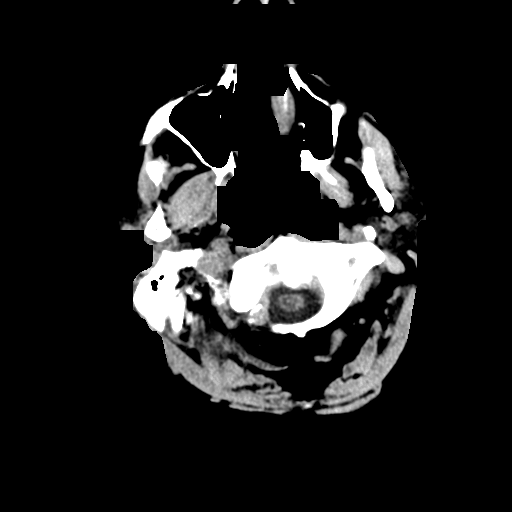
[im 3/34  bone]
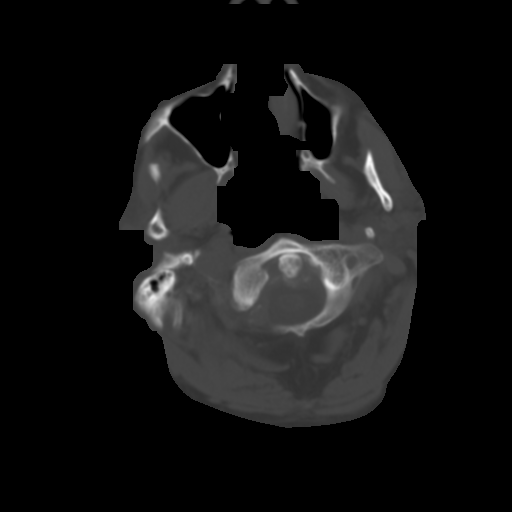
[im 6/34  brain]
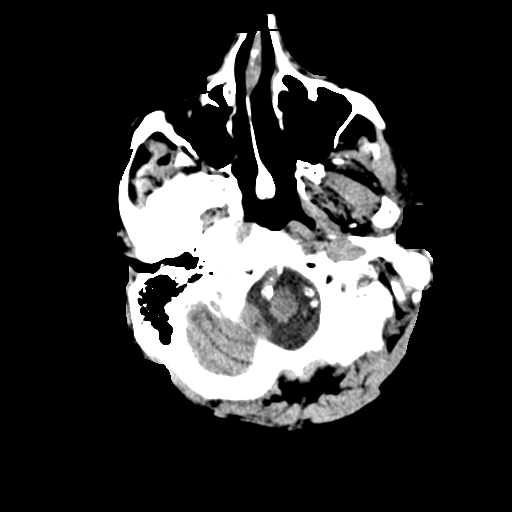
[im 10/34  brain]
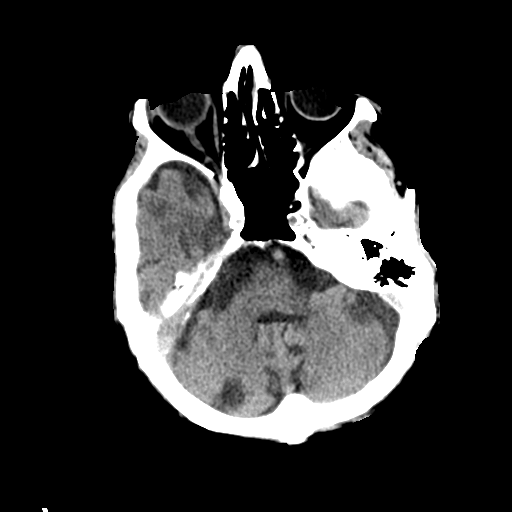
[im 12/34  brain]
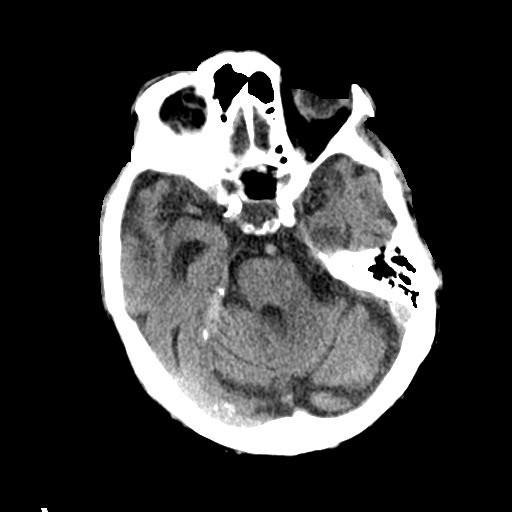
[im 15/34  brain]
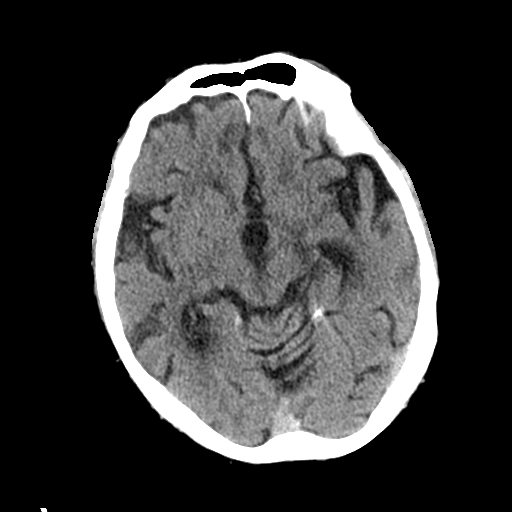
[im 15/34  bone]
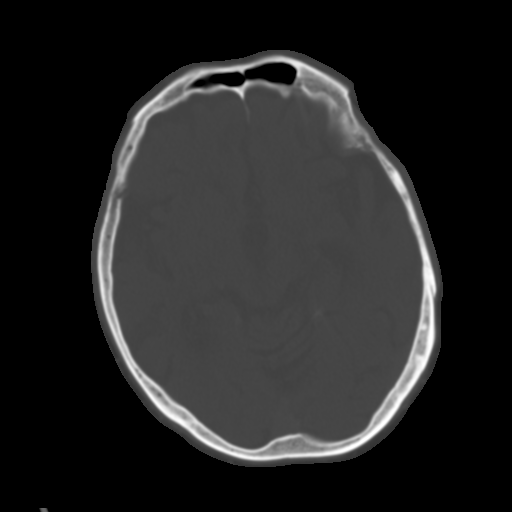
[im 19/34  brain]
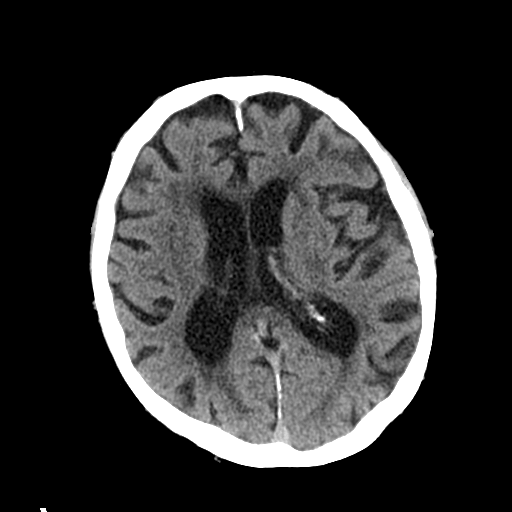
[im 22/34  brain]
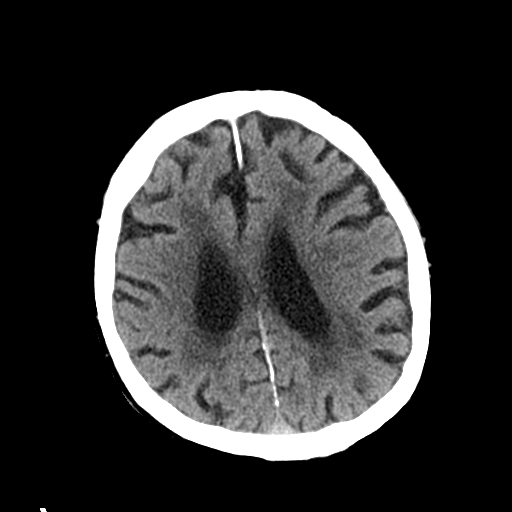
[im 26/34  brain]
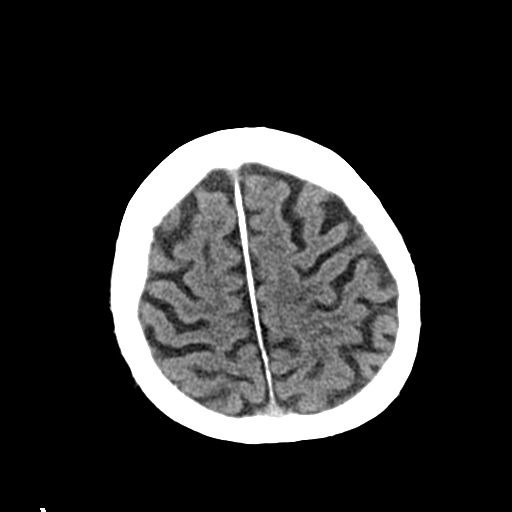
[im 28/34  brain]
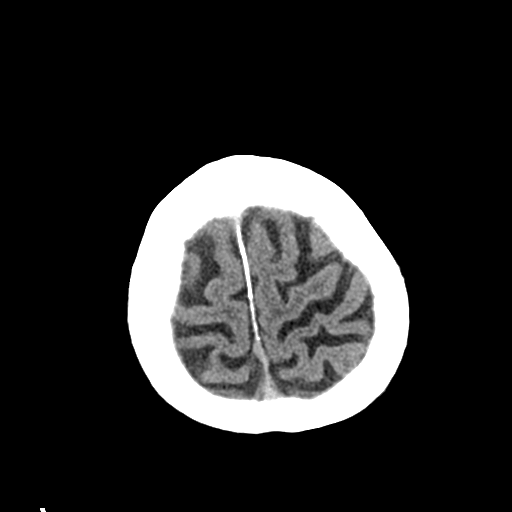
[im 28/34  bone]
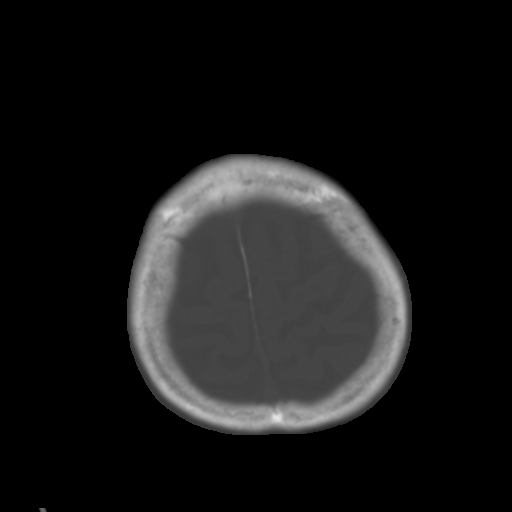
[im 31/34  brain]
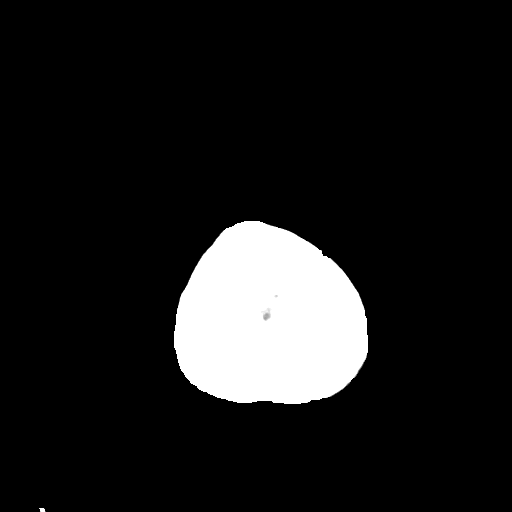

[Series 4: coronal soft tissue · coronal · 0.36mm/px · 3 of 72 slices shown]
[im 24/72  brain]
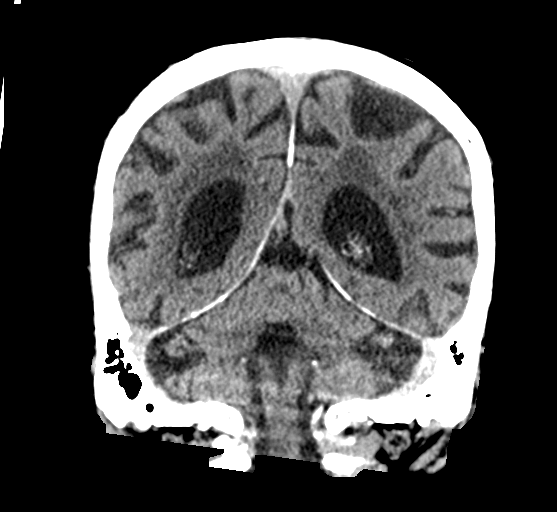
[im 32/72  brain]
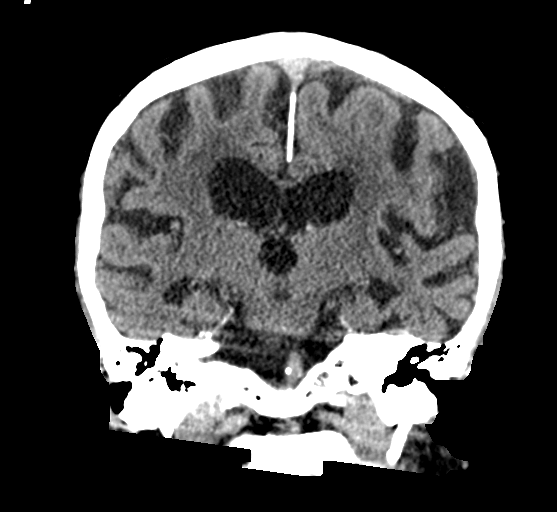
[im 40/72  brain]
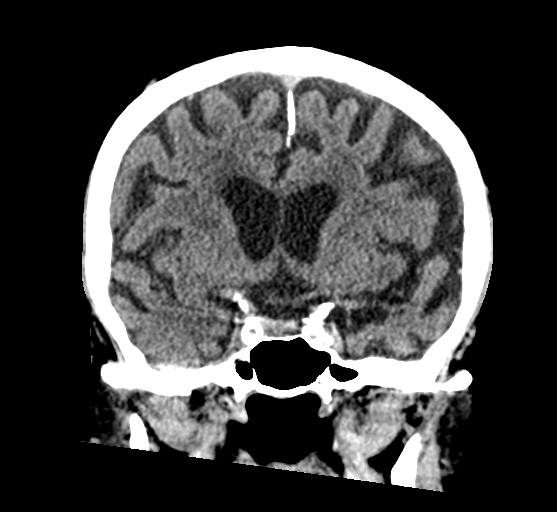

[Series 5: sagittal soft tissue · sagittal · 0.36mm/px · 3 of 55 slices shown]
[im 21/55  brain]
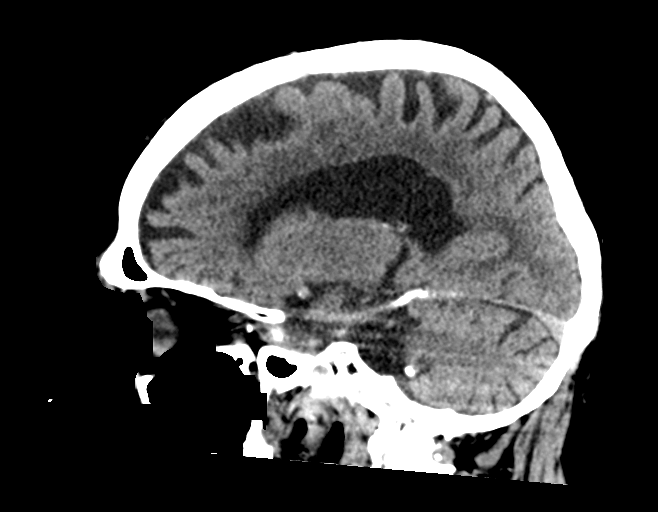
[im 28/55  brain]
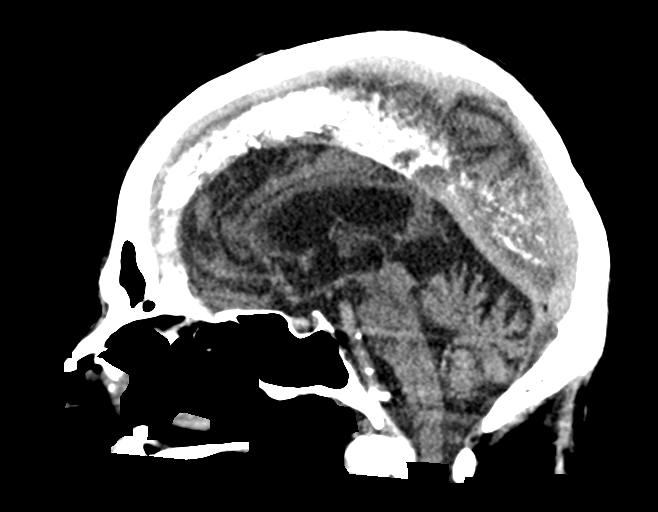
[im 34/55  brain]
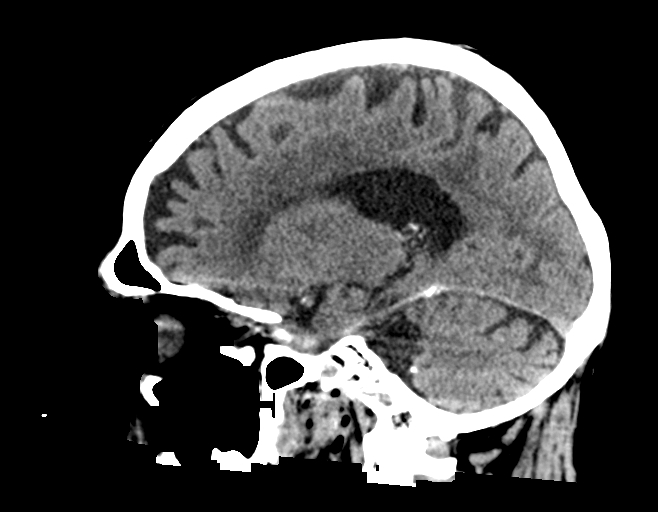

[16 of 47 positions shown; findings below may reference images not displayed]

FINDINGS: Brain: There is moderate severity cerebral atrophy with widening of
the extra-axial spaces and ventricular dilatation.
There are areas of decreased attenuation within the white matter
tracts of the supratentorial brain, consistent with microvascular
disease changes.

A small area of cortical encephalomalacia, with adjacent chronic
white matter low attenuation, is seen within the cerebellum on the
right.

Vascular: No hyperdense vessel or unexpected calcification.

Skull: Normal. Negative for fracture or focal lesion.

Sinuses/Orbits: No acute finding.

Other: None.
IMPRESSION: 1. Generalized cerebral atrophy.
2. No acute intracranial abnormality.
